# Patient Record
Sex: Female | Born: 1950 | Race: Black or African American | Hispanic: No | Marital: Married | Smoking: Former smoker
Health system: Southern US, Community
[De-identification: ages and names within clinical notes are randomized; demographics above are authoritative.]

## PROBLEM LIST (undated history)

## (undated) DIAGNOSIS — I10 Essential (primary) hypertension: Secondary | ICD-10-CM

## (undated) DIAGNOSIS — F419 Anxiety disorder, unspecified: Secondary | ICD-10-CM

## (undated) DIAGNOSIS — F32A Depression, unspecified: Secondary | ICD-10-CM

## (undated) DIAGNOSIS — I2510 Atherosclerotic heart disease of native coronary artery without angina pectoris: Secondary | ICD-10-CM

## (undated) DIAGNOSIS — G709 Myoneural disorder, unspecified: Secondary | ICD-10-CM

## (undated) DIAGNOSIS — E785 Hyperlipidemia, unspecified: Secondary | ICD-10-CM

## (undated) DIAGNOSIS — R7303 Prediabetes: Secondary | ICD-10-CM

## (undated) DIAGNOSIS — F329 Major depressive disorder, single episode, unspecified: Secondary | ICD-10-CM

## (undated) DIAGNOSIS — K5900 Constipation, unspecified: Secondary | ICD-10-CM

## (undated) HISTORY — PX: POLYPECTOMY: SHX149

## (undated) HISTORY — DX: Major depressive disorder, single episode, unspecified: F32.9

## (undated) HISTORY — DX: Myoneural disorder, unspecified: G70.9

## (undated) HISTORY — DX: Constipation, unspecified: K59.00

## (undated) HISTORY — DX: Essential (primary) hypertension: I10

## (undated) HISTORY — PX: DILATION AND CURETTAGE OF UTERUS: SHX78

## (undated) HISTORY — DX: Hyperlipidemia, unspecified: E78.5

## (undated) HISTORY — PX: COLONOSCOPY: SHX174

## (undated) HISTORY — DX: Depression, unspecified: F32.A

## (undated) HISTORY — DX: Anxiety disorder, unspecified: F41.9

---

## 1997-12-13 ENCOUNTER — Emergency Department (HOSPITAL_COMMUNITY): Admission: EM | Admit: 1997-12-13 | Discharge: 1997-12-13 | Payer: Self-pay | Admitting: Emergency Medicine

## 1999-07-30 ENCOUNTER — Other Ambulatory Visit: Admission: RE | Admit: 1999-07-30 | Discharge: 1999-07-30 | Payer: Self-pay | Admitting: Gastroenterology

## 1999-07-30 ENCOUNTER — Encounter (INDEPENDENT_AMBULATORY_CARE_PROVIDER_SITE_OTHER): Payer: Self-pay | Admitting: Specialist

## 1999-08-03 ENCOUNTER — Ambulatory Visit (HOSPITAL_COMMUNITY): Admission: RE | Admit: 1999-08-03 | Discharge: 1999-08-03 | Payer: Self-pay | Admitting: Gastroenterology

## 1999-08-03 ENCOUNTER — Encounter: Payer: Self-pay | Admitting: Gastroenterology

## 1999-08-08 ENCOUNTER — Other Ambulatory Visit: Admission: RE | Admit: 1999-08-08 | Discharge: 1999-08-08 | Payer: Self-pay | Admitting: Obstetrics and Gynecology

## 1999-08-08 ENCOUNTER — Encounter (INDEPENDENT_AMBULATORY_CARE_PROVIDER_SITE_OTHER): Payer: Self-pay

## 2000-02-16 ENCOUNTER — Inpatient Hospital Stay (HOSPITAL_COMMUNITY): Admission: EM | Admit: 2000-02-16 | Discharge: 2000-02-20 | Payer: Self-pay | Admitting: *Deleted

## 2000-02-18 ENCOUNTER — Encounter: Payer: Self-pay | Admitting: Gastroenterology

## 2001-09-20 ENCOUNTER — Emergency Department (HOSPITAL_COMMUNITY): Admission: EM | Admit: 2001-09-20 | Discharge: 2001-09-20 | Payer: Self-pay | Admitting: Emergency Medicine

## 2001-09-20 ENCOUNTER — Encounter: Payer: Self-pay | Admitting: Emergency Medicine

## 2003-12-13 ENCOUNTER — Encounter: Admission: RE | Admit: 2003-12-13 | Discharge: 2003-12-13 | Payer: Self-pay | Admitting: Internal Medicine

## 2004-06-13 ENCOUNTER — Ambulatory Visit: Payer: Self-pay | Admitting: Gastroenterology

## 2006-11-04 ENCOUNTER — Emergency Department (HOSPITAL_COMMUNITY): Admission: EM | Admit: 2006-11-04 | Discharge: 2006-11-04 | Payer: Self-pay | Admitting: Family Medicine

## 2007-12-27 ENCOUNTER — Ambulatory Visit: Payer: Self-pay | Admitting: Gastroenterology

## 2008-01-06 ENCOUNTER — Telehealth: Payer: Self-pay | Admitting: Gastroenterology

## 2008-01-11 ENCOUNTER — Ambulatory Visit: Payer: Self-pay | Admitting: Gastroenterology

## 2008-01-11 ENCOUNTER — Encounter: Payer: Self-pay | Admitting: Gastroenterology

## 2008-01-13 ENCOUNTER — Encounter: Payer: Self-pay | Admitting: Gastroenterology

## 2008-05-13 IMAGING — CR DG ABDOMEN ACUTE W/ 1V CHEST
3 series · 3 of 3 positions shown · non-contrast
Comparison: none

CLINICAL DATA: Vomiting and nausea.
 ACUTE ABDOMINAL SERIES WITH CHEST ? 3 VIEW:

[w chest pa]
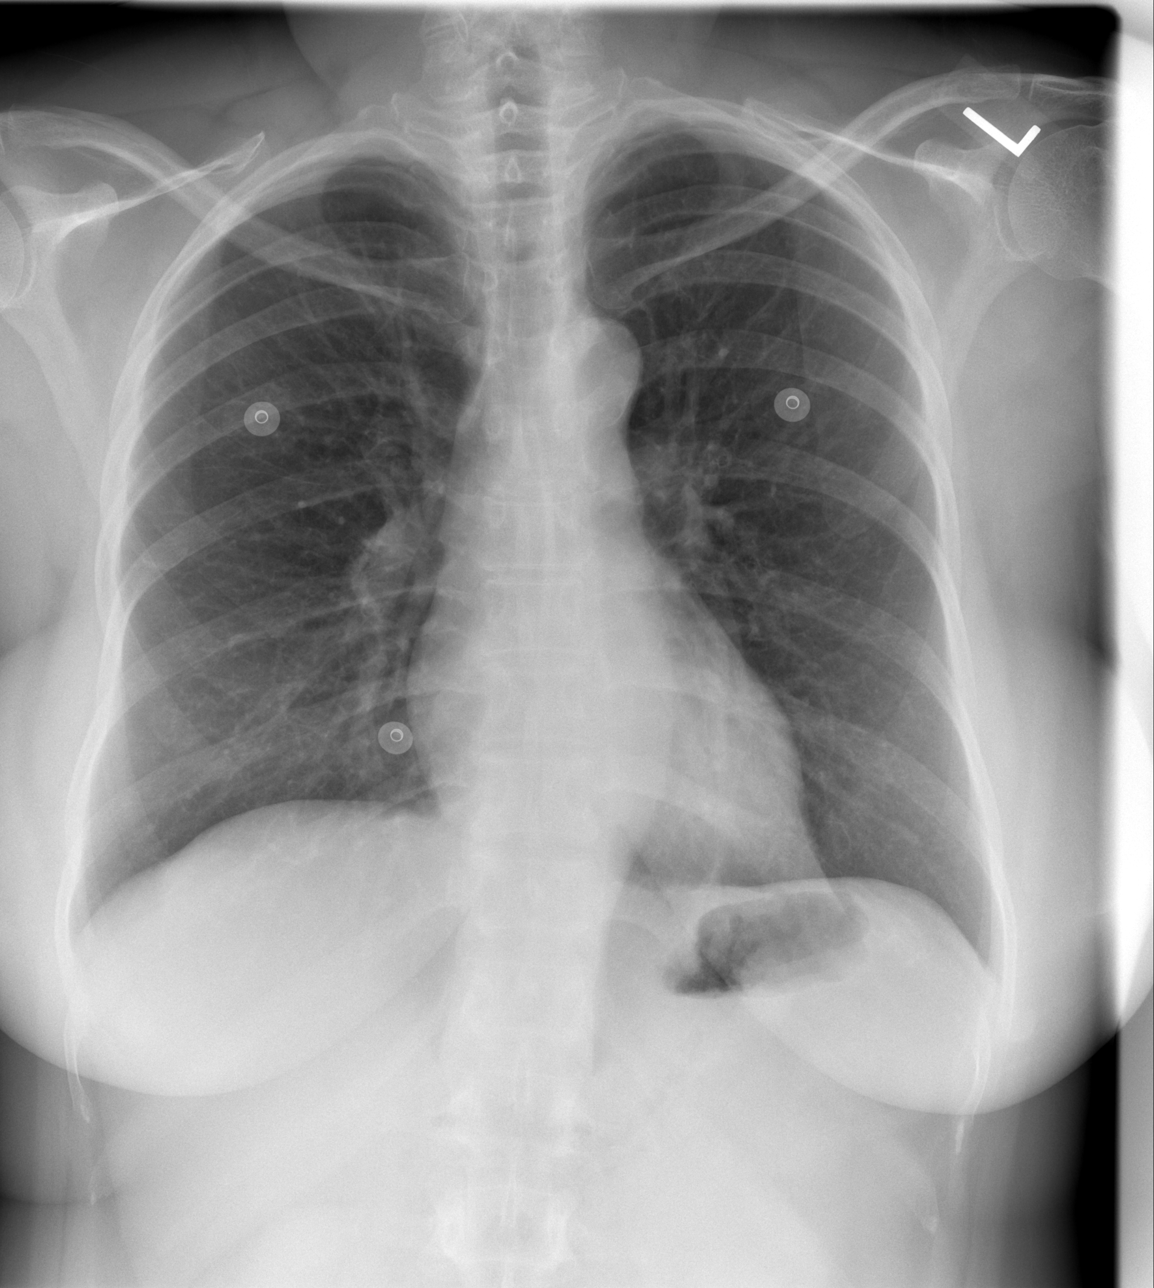

[w abdomen upright]
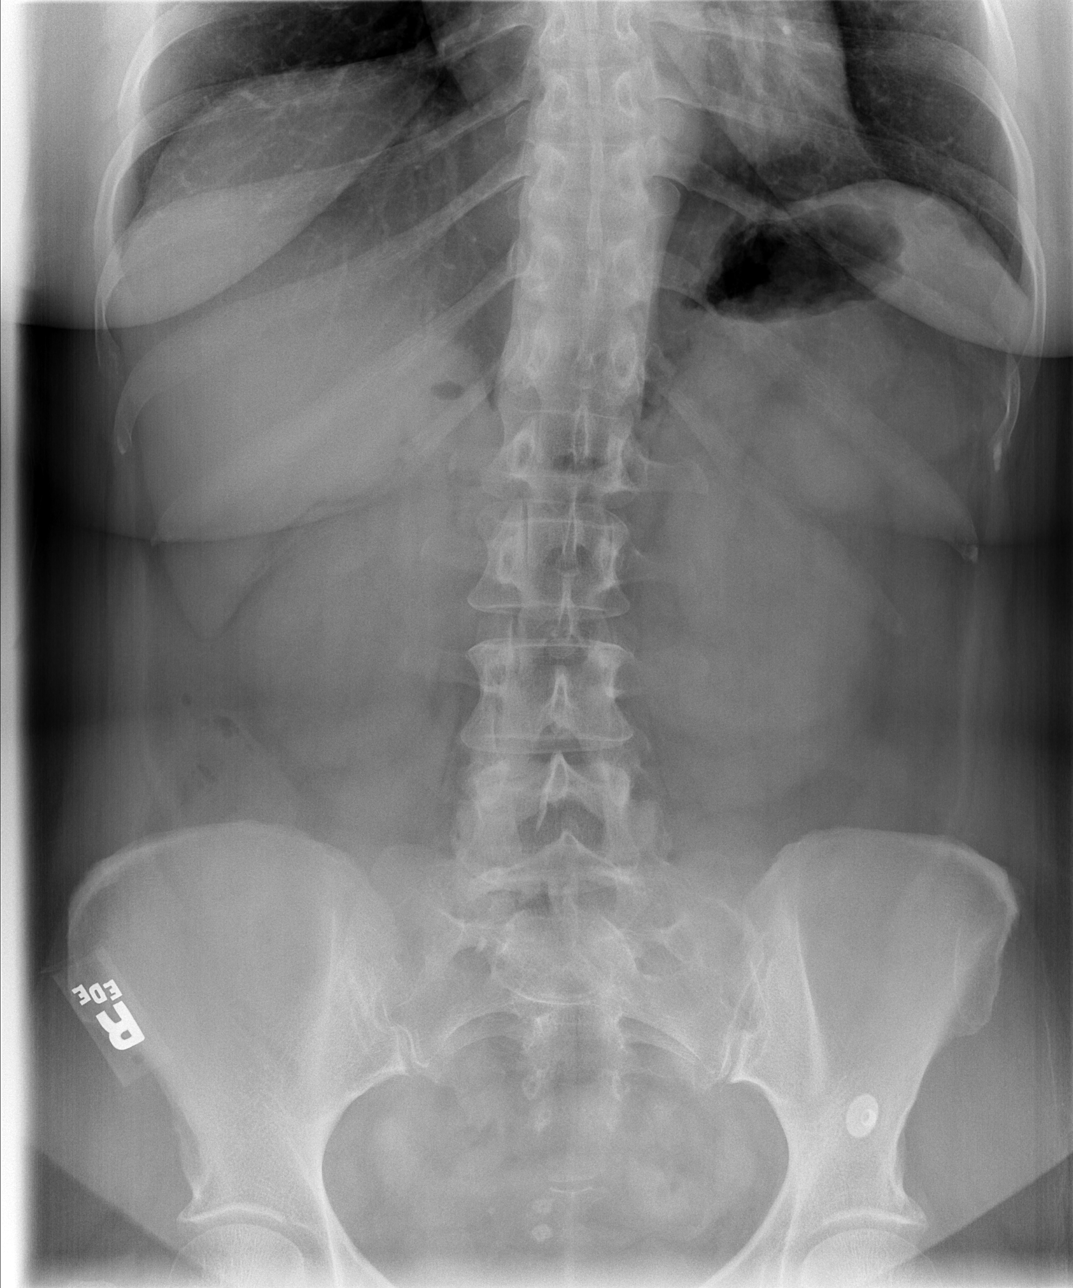

[t abdomen supine]
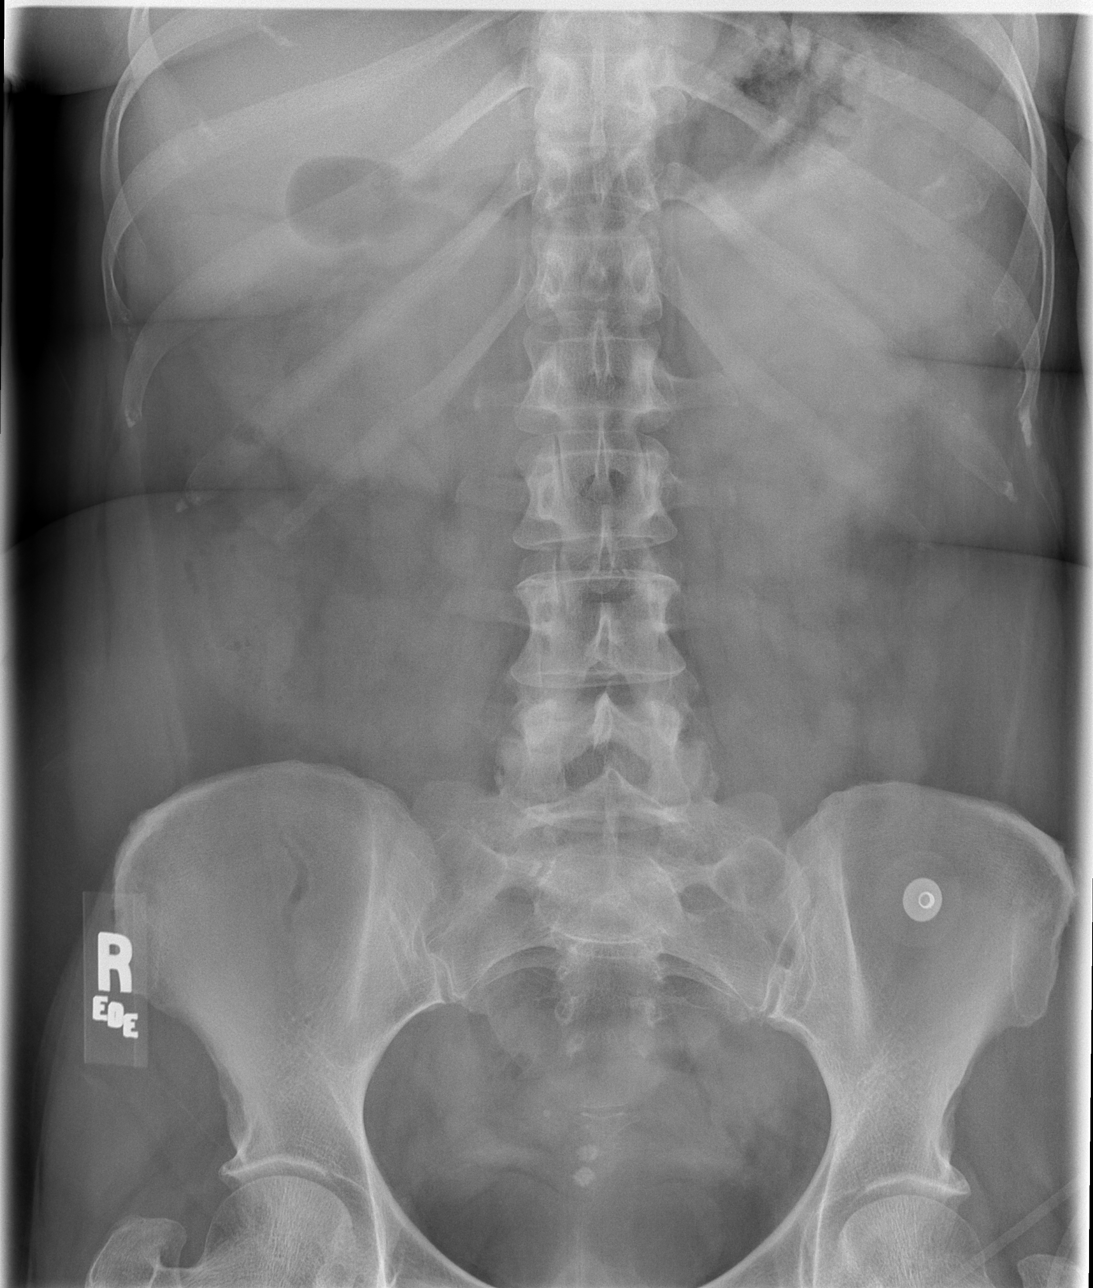

[3 of 3 positions shown; findings below may reference images not displayed]

FINDINGS: Heart size is normal.  The vascularity is normal and the lungs are clear.  Bowel gas pattern is normal without obstruction or free air.  No renal calculi and no bony abnormality are noted.
IMPRESSION: Negative.

## 2010-06-16 ENCOUNTER — Encounter: Payer: Self-pay | Admitting: Internal Medicine

## 2010-10-11 NOTE — Consult Note (Signed)
[facility]  Patient:    Tanya Pace, Tanya Pace                 MRN: [id number] Proc. Date: 02/17/00 Adm. Date:  [id number] Attending:  Judie Petit                          Consultation Report  DATE OF BIRTH:  Jan 07, 1951.  REFERRING PHYSICIAN:  Dr. Lorelle Formosa.  CONCLUSIONS 1. Chest discomfort, nausea, vomiting.  Etiology is uncertain.  This would be    an atypical presentation for coronary disease.  Rule out pericarditis.    Rule out gastrointestinal source. 2. Hypertension. 3. History of gastroesophageal reflux disease.  RECOMMENDATIONS 1. Serial enzymes. 2. EKG during pain to rule out evidence of ischemia. 3. If the patient rules out for infarction, would consider an adenosine    Cardiolite; if she rules in, will obviously need coronary angiography. 4. Consider anxiolytic therapy. 5. Two-dimensional echocardiogram to rule out pericardial disease. 6. Consider noncardiac causes of pain such as GI sources, psychogenic sources    or other.  COMMENTS:  The patient is 60 years of age and has a one-week history of intermittent chest tightness in the substernal region associated with nausea, vomiting and anxiety.  No dyspnea.  Episodes have occurred in waves, lasting anywhere from 30 to 60 minutes.  EKGs during episodes have not shown any evidence of acute ischemia.  Currently, at the time of history, the patient was having "shakes and chills" and so there is a tickle in the lower part of her sternal region.  There is no exertional component to her complaint.  MEDICATIONS:  Clonidine, hydrochlorothiazide and Metamucil.  PAST MEDICAL HISTORY:  Hypertension.  Gastroesophageal reflux.  HABITS:  Positive for smoking one-half to one pack per day.  Positive for ethanol consumption socially.  REVIEW OF SYSTEMS:  Unremarkable with the exception of history of arthritis.  PHYSICAL EXAMINATION  GENERAL:  On exam, patient is  obviously distressed and anxious; she is hyperventilating.  VITAL SIGNS:  Respirations are 24.  Blood pressure 144/80.  Heart rate 68.  SKIN:  Warm and dry.  HEENT:  No xanthelasma.  Extraocular movements are full.  NECK:  No JVD, carotid bruits or thyromegaly.  CHEST:  Clear.  CARDIAC:  Unremarkable.  No clicks, rubs, murmurs or gallops are heard.  ABDOMEN:  Soft.  Liver and spleen are not palpable.  Bowel sounds are normal.  EXTREMITIES:  No edema.  Pulses are 2+ and symmetric.  NEUROLOGIC:  Exam is unremarkable.  LABORATORY AND X-RAY FINDINGS:  EKGs x 5 have revealed a normal sinus rhythm or sinus bradycardia, but no acute ischemic change.  Occasional PACs were noticed.  CK-MB is negative x 3.  Troponin I negative x 2.  Chest x-ray is normal. DD:  02/18/00 TD:  02/18/00 Job: [id number]

## 2010-10-11 NOTE — Discharge Summary (Signed)
[facility]  Patient:    Tanya Pace, Tanya Pace                 MRN: [id number] Adm. Date:  [id number] Disc. Date: 02/20/00 Attending:  Judie Petit                           Discharge Summary  ADMISSION DIAGNOSES: 1. Substernal chest pain. 2. Hypertension.  DISCHARGE DIAGNOSES: 1. Gastroenteritis, probable viral. 2. Hypertension. 3. Anxiety neurosis.  DISCHARGE CONDITION:  Stable.  DISCHARGE FOLLOW-UP:  Follow up in approximately one week.  DISCHARGE MEDICATIONS: 1. Clonidine 0.2 mg b.i.d. 2. HCTZ 25 mg daily. 3. Protonix 40 mg daily. 4. Clorazepate 3.75 mg b.i.d. or t.i.d. p.r.n. nervousness.  HISTORY OF PRESENT ILLNESS:  The patient is a 60 year old married black woman who presented to the emergency room with substernal chest pain that lasted approximately one hour and occurred during BM.  She said the pain was squeezing and midsternal without associated diaphoresis or radiation.  Pain was more intense with deep inspiration and in the past had been relieved by aspirin.  The patient stated that she had had similar episodes in the past, but not quite as severe as this one.  She thus presented to the emergency room and was evaluated in the emergency room by the emergency room physician and referred to on-call physician, Dr. Daphine Deutscher, and admitted.  MEDICATIONS ON ADMISSION:  Clonidine, HCTZ, and Metamucil.  ALLERGIES:  Only allergy is to ERYTHROMYCIN.  PAST MEDICAL HISTORY:  Hypertension and osteoarthritis.  FAMILY HISTORY, SOCIAL HISTORY, PERSONAL HISTORY, AND REVIEW OF SYSTEMS: Outlined in Admission History and Physical.  PHYSICAL EXAMINATION:  VITAL SIGNS:  Blood pressure 135/77, pulse 53, respirations 20, temperature 97.4.  HEENT:  PERRLA, EOMs intact.  NECK:  Supple with no jugular venous distension, no thyromegaly.  CHEST:  Normal lungs to auscultation.  CARDIOVASCULAR:  Heart had regular rhythm with  bradycardia.  ABDOMEN:  Soft and flat with normoactive bowel sounds.  EXTREMITIES:  Had no clubbing, cyanosis, edema, or deformity.  NEUROLOGIC:  Grossly physiologic.  LABORATORY AND X-RAY DATA:  Free T4 was 2.8.  Hepatic functions were normal. CBC revealed hemoglobin 11.8 with white count of 7.2, platelets 327,000. Differential revealed 88 segs, 9 lymphocytes, 2 monocytes.  Lipase was 19 with amylase 123.  Urinalysis was normal except for ketone of greater than 80.  CBC revealed white count of 3.8 initially with red blood cell count of 3.82, hemoglobin 10.9, and repeat white count was 7.2 with hemoglobin 11.8, hematocrit 36, platelets 327.  Initial amylase was 268, repeat was 123.  Lipid profile was normal.  T4 was 8.6 with TSH 0.9 and free T3 of 2.8.  Cardiac enzymes were negative.  Chest x-ray revealed no active disease.  Ultrasound of abdomen was negative.  HOSPITAL COURSE:  The patient was admitted to the hospital and begun on initially some nitroglycerin and nitroglycerin paste 1/2 inch q.6h. to chest. She had rather intense emesis episodes with some associated substernal chest pain which was variably relieved with the nitroglycerin.  She did have some junctional arrhythmia on telemetry monitoring during these episodes.  The patient was provided Phenergan IV and subsequently a single dose of Zofran 4 mg IV with better relief of her nausea.  She was seen in consultation by Dr. Verdis Prime, cardiologist, who did not think her symptoms were related to cardiac etiology.  She was subsequently seen by gastroenterologist, Dr. Russella Dar, who  has seen her in the past and thought she had gastroenteritis probably of viral etiology.  Her treatment was changed from Pepcid 20 mg to Protonix 40 mg IV.  She was continued with similar treatment and gradually improved.  She thus is able to tolerate a diet and totally asymptomatic.  She is at this time being discharged for outpatient  management.  CONSULTANTS:  Dr. Verdis Prime, cardiologist, and Dr. Russella Dar, gastroenterologist.  DISCHARGE DISPOSITION:  The patient is discharged for outpatient management. DD:  02/20/00 TD:  02/20/00 Job: [id number]

## 2010-10-11 NOTE — H&P (Signed)
[facility]  Patient:    Tanya Pace, Tanya Pace                 MRN: [id number] Adm. Date:  [id number] Attending:  Judie Petit                         History and Physical  DATE OF BIRTH:  [birth year].  HISTORY OF PRESENT ILLNESS:  This is a 60 year old African-American female who presented with episode of chest pain x 2.  Patient states that the chest pain lasted approximately one hour and occurred during a bowel movement.  At time of presentation to the ED, patient denied pain.  She characterizes the pain as midsternal, squeezing, accompanied by nausea and shortness of breath.  No diaphoresis or vomiting.  The pain is now radiating and worsens by deep inspiration.  Aspirin did relieve the pain.  Patient with a history of hypertension and has had similar episodes in the past but not as severe as this.  She characterizes the pain as 8/10 at its worst.  She also reports a 30-pound weight loss over the past year.  MEDICATIONS 1. Clonidine q.h.s. 2. HCTZ 25 mg per day. 3. Metamucil.  ALLERGIES:  ERYTHROMYCIN.  PAST MEDICAL HISTORY:  Significant for hypertension and osteoarthritis.  PAST SURGICAL HISTORY:  Significant for colon polyps in March of 2001 and a D&C 30 years ago.  FAMILY HISTORY:  Significant for CHF in the father and two maternal aunts of coronary artery disease.  SOCIAL HISTORY:  She smokes a half a pack per day for the past 16 years.  PHYSICAL EXAMINATION  GENERAL:  In no acute distress.  Alert and oriented.  VITAL SIGNS:  Blood pressure 135/77, heart rate 62, respiratory rate 20, and temperature 97.4.  HEENT:  Mucous membranes moist.  Pupils are equal, round and reactive to light.  Sclerae anicteric.  NECK:  Supple, without adenopathy, thyromegaly, JVD or bruits.  HEART:  Regular rate and rhythm, with no murmurs, gallops, thrills or rubs.  LUNGS:  Clear to auscultation bilaterally.  ABDOMEN:  Soft,  nontender and distended with normoactive bowel sounds.  No masses.  EXTREMITIES:  Moist, without clubbing, cyanosis, or edema.  NEUROLOGIC:  Nonfocal.  LABORATORY AND X-RAY FINDINGS:  Data reveals a CK of 149, troponin of less than 0.03, MB less than 0.3, white count of 10.8, hemoglobin 10.9, hematocrit 32.4, platelets 282,000.  Chest x-ray:  No active disease.  EKG:  Normal sinus rhythm.  CMP:  All within normal limits.  ASSESSMENT:  [age]-year-old with hypertension and chest pain.  Will admit the patient as she does have several risk factors and check serial enzymes to rule out acute myocardial infarction.  Will check 12-lead electrocardiogram in the morning; will also place patient on nitroglycerin paste to anterior chest wall, resume home medications and consult cardiology, once we see if there is any evidence of any myocardial damage. DD:  02/16/00 TD:  02/17/00 Job: [id number]

## 2011-03-13 LAB — COMPREHENSIVE METABOLIC PANEL
Alkaline Phosphatase: 58
BUN: 18
CO2: 29
GFR calc non Af Amer: 45 — ABNORMAL LOW
Glucose, Bld: 122 — ABNORMAL HIGH
Potassium: 2.8 — ABNORMAL LOW
Total Protein: 6.7

## 2011-03-13 LAB — CBC
HCT: 32.5 — ABNORMAL LOW
Hemoglobin: 10.9 — ABNORMAL LOW
MCHC: 33.6
RDW: 14

## 2011-03-13 LAB — DIFFERENTIAL
Basophils Absolute: 0
Basophils Relative: 0
Monocytes Relative: 3
Neutro Abs: 6.7
Neutrophils Relative %: 87 — ABNORMAL HIGH

## 2011-03-13 LAB — B-NATRIURETIC PEPTIDE (CONVERTED LAB): Pro B Natriuretic peptide (BNP): 73

## 2011-03-13 LAB — POCT CARDIAC MARKERS
Myoglobin, poc: 174
Operator id: 189501

## 2011-03-13 LAB — LIPASE, BLOOD: Lipase: 33

## 2012-12-21 ENCOUNTER — Encounter: Payer: Self-pay | Admitting: Gastroenterology

## 2013-01-21 ENCOUNTER — Other Ambulatory Visit: Payer: Self-pay

## 2013-01-21 DIAGNOSIS — Z1231 Encounter for screening mammogram for malignant neoplasm of breast: Secondary | ICD-10-CM

## 2013-02-23 ENCOUNTER — Ambulatory Visit (AMBULATORY_SURGERY_CENTER): Payer: Self-pay | Admitting: *Deleted

## 2013-02-23 VITALS — Ht 65.0 in | Wt 185.2 lb

## 2013-02-23 DIAGNOSIS — Z86010 Personal history of colonic polyps: Secondary | ICD-10-CM

## 2013-02-23 MED ORDER — MOVIPREP 100 G PO SOLR: 1.0000 | Freq: Once | ORAL | Status: DC

## 2013-02-23 NOTE — Progress Notes (Signed)
No egg or soy allergy. ewm last colon 2009 w/ Stark in epic. + TA. Family hx of colon cancer in mother. ewm No home 02 use, no cpap use. ewm Pt declined colon emmi video. ewm No problems with past sedation. ewm

## 2013-02-24 ENCOUNTER — Encounter: Payer: Self-pay | Admitting: Gastroenterology

## 2013-03-11 ENCOUNTER — Encounter: Payer: Self-pay | Admitting: Gastroenterology

## 2013-03-11 ENCOUNTER — Ambulatory Visit (AMBULATORY_SURGERY_CENTER): Admitting: Gastroenterology

## 2013-03-11 VITALS — BP 154/94 | HR 74 | Temp 96.4°F | Resp 13 | Ht 65.0 in | Wt 185.0 lb

## 2013-03-11 DIAGNOSIS — Z800 Family history of malignant neoplasm of digestive organs: Secondary | ICD-10-CM

## 2013-03-11 DIAGNOSIS — D126 Benign neoplasm of colon, unspecified: Secondary | ICD-10-CM

## 2013-03-11 DIAGNOSIS — Z86010 Personal history of colonic polyps: Secondary | ICD-10-CM

## 2013-03-11 MED ORDER — SODIUM CHLORIDE 0.9 % IV SOLN: 500.0000 mL | INTRAVENOUS | Status: DC

## 2013-03-11 NOTE — Progress Notes (Signed)
Patient did not experience any of the following events: a burn prior to discharge; a fall within the facility; wrong site/side/patient/procedure/implant event; or a hospital transfer or hospital admission upon discharge from the facility. (G8907) Patient did not have preoperative order for IV antibiotic SSI prophylaxis. (G8918)  

## 2013-03-11 NOTE — Op Note (Signed)
[facility] [address] [place], [address]   COLONOSCOPY PROCEDURE REPORT  PATIENT: Tanya, Pace  MR#: [id number] BIRTHDATE: [birth year] , 62  yrs. old GENDER: Female ENDOSCOPIST: Meryl Dare, MD, [facility] PROCEDURE DATE:  03/11/2013 PROCEDURE:   Colonoscopy with snare polypectomy First Screening Colonoscopy - Avg.  risk and is 50 yrs.  old or older - No.  Prior Negative Screening - Now for repeat screening. N/A  History of Adenoma - Now for follow-up colonoscopy & has been > or = to 3 yrs.  Yes hx of adenoma.  Has been 3 or more years since last colonoscopy.  Polyps Removed Today? Yes. ASA CLASS:   Class II INDICATIONS:Patient's personal history of adenomatous colon polyps and patient's immediate family history of colon cancer. MEDICATIONS: MAC sedation, administered by CRNA and propofol (Diprivan) 200mg  IV DESCRIPTION OF PROCEDURE:   After the risks benefits and alternatives of the procedure were thoroughly explained, informed consent was obtained.  A digital rectal exam revealed no abnormalities of the rectum.   The [id number]  endoscope was introduced through the anus and advanced to the cecum, which was identified by both the appendix and ileocecal valve. No adverse events experienced.   The quality of the prep was good, using MoviPrep  The instrument was then slowly withdrawn as the colon was fully examined.  COLON FINDINGS: Three sessile polyps measuring 4-6 mm in size were found in the sigmoid colon.  A polypectomy was performed with a cold snare.  The resection was complete and the polyp tissue was completely retrieved.   The colon was otherwise normal.  There was no diverticulosis, inflammation, polyps or cancers unless previously stated.  Retroflexed views revealed moderate internal hemorrhoids. The time to cecum=3 minutes 15 seconds.  Withdrawal time=9 minutes 46 seconds.  The scope was withdrawn and the procedure  completed. COMPLICATIONS: There were no complications.  ENDOSCOPIC IMPRESSION: 1.   Three sessile polyps measuring 4-6 mm in the sigmoid colon; polypectomy performed with a cold snare 2.   Moderate internal hemorrhoids  RECOMMENDATIONS: 1.  Await pathology results 2.  Repeat Colonoscopy in 5 years.  eSigned:  Meryl Dare, MD, [facility] 03/11/2013 1:53 PM   cc: Dorothyann Peng, MD

## 2013-03-11 NOTE — Progress Notes (Signed)
Called to room to assist during endoscopic procedure.  Patient ID and intended procedure confirmed with present staff. Received instructions for my participation in the procedure from the performing physician.  

## 2013-03-11 NOTE — Progress Notes (Signed)
Procedure ends, to recovery, report given and VSS. 

## 2013-03-11 NOTE — Progress Notes (Signed)
No egg or soy allergy. ewm 

## 2013-03-11 NOTE — Patient Instructions (Signed)

## 2013-03-14 ENCOUNTER — Telehealth: Payer: Self-pay | Admitting: *Deleted

## 2013-03-14 NOTE — Telephone Encounter (Signed)
  Follow up Call-  Call back number 03/11/2013  Post procedure Call Back phone  # [phone]  Permission to leave phone message Yes   Spoke with her husband  Patient questions:  Do you have a fever, pain , or abdominal swelling? no Pain Score  0 *  Have you tolerated food without any problems? yes  Have you been able to return to your normal activities? yes  Do you have any questions about your discharge instructions: Diet   no Medications  no Follow up visit  no  Do you have questions or concerns about your Care? no  Actions: * If pain score is 4 or above: No action needed, pain <4.

## 2013-03-15 ENCOUNTER — Encounter: Payer: Self-pay | Admitting: Gastroenterology

## 2014-12-19 ENCOUNTER — Encounter: Payer: Self-pay | Admitting: Gastroenterology

## 2015-04-03 ENCOUNTER — Encounter: Payer: Self-pay | Admitting: Cardiovascular Disease

## 2015-04-03 ENCOUNTER — Ambulatory Visit (INDEPENDENT_AMBULATORY_CARE_PROVIDER_SITE_OTHER): Admitting: Cardiovascular Disease

## 2015-04-03 VITALS — BP 130/80 | HR 56 | Resp 16 | Ht 65.0 in | Wt 178.0 lb

## 2015-04-03 DIAGNOSIS — I10 Essential (primary) hypertension: Secondary | ICD-10-CM

## 2015-04-03 DIAGNOSIS — I7300 Raynaud's syndrome without gangrene: Secondary | ICD-10-CM

## 2015-04-03 DIAGNOSIS — E785 Hyperlipidemia, unspecified: Secondary | ICD-10-CM

## 2015-04-03 DIAGNOSIS — R002 Palpitations: Secondary | ICD-10-CM

## 2015-04-03 DIAGNOSIS — Z79899 Other long term (current) drug therapy: Secondary | ICD-10-CM | POA: Diagnosis not present

## 2015-04-03 DIAGNOSIS — R001 Bradycardia, unspecified: Secondary | ICD-10-CM

## 2015-04-03 MED ORDER — VERAPAMIL HCL ER 180 MG PO TBCR: 180.0000 mg | EXTENDED_RELEASE_TABLET | Freq: Every day | ORAL | Status: DC

## 2015-04-03 NOTE — Patient Instructions (Signed)
Your physician has recommended you make the following change in your medication:   WEAN OFF OF METOPROLOL BY TAKING 1/2 TABLET X 4 DAYS THEN STOP.  START VERAPAMIL SR 180 MG DAILY.  THIS HAS BEEN SENT TO YOUR PHARMACY.  Your physician recommends that you return for lab work in: [place].  Dr. Sallyanne Kuster recommends that you schedule a follow-up appointment in: 3-4 MONTHS

## 2015-04-03 NOTE — Progress Notes (Signed)
Patient ID: Tanya Pace, female   DOB: 06-18-1950, 64 y.o.   MRN: [id number]     Cardiology Office Note   Date:  04/05/2015   ID:  Tanya Pace 1950-08-02, MRN [id number]  PCP:  Maximino Greenland, MD  Cardiologist:   Sanda Klein, MD   Chief Complaint  Patient presents with  . New Patient (Initial Visit)  . Dizziness  . Edema    hand, feet, legs, and ankles everyday      History of Present Illness: Tanya Pace is a 64 y.o. female who presents to re-establish Cardiology follow-up.  She used to see Dr. Melvern Banker for "episodic high BP", palpitations and Raynaud's sd. His old records are not currently available for review.  Her BP has been well controlled. She continues to have symptoms of Raynaud's during both the cold and the warm season. Her fingers and toes become pale and numb. She has very infrequent palpitations now. She denies dyspnea and angina.   She is no longer taking any lipid lowering agents. She was started on neurontin for neuropathy  Past Medical History  Diagnosis Date  . Hyperlipidemia   . Neuromuscular disorder ([place])     peripheral neuropathy  . Anxiety   . Diabetes mellitus without complication ([place])     borderline-no meds  . Depression   . Hypertension   . Constipation     Past Surgical History  Procedure Laterality Date  . Colonoscopy    . Polypectomy    . Dilation and curettage of uterus      x 5-6 1979-1982     Current Outpatient Prescriptions  Medication Sig Dispense Refill  . gabapentin (NEURONTIN) 300 MG capsule Take 300 mg by mouth 3 (three) times daily. [phone] mg as needed    . lisinopril-hydrochlorothiazide (PRINZIDE,ZESTORETIC) 10-12.5 MG per tablet Take 1 tablet by mouth daily.    . metoprolol succinate (TOPROL-XL) 25 MG 24 hr tablet Take 25 mg by mouth daily.    Marland Kitchen POTASSIUM CHLORIDE PO Take 1 capsule by mouth daily.    . verapamil (CALAN-SR) 180 MG CR tablet Take 1 tablet (180 mg total) by mouth at  bedtime. 90 tablet 3   No current facility-administered medications for this visit.    Allergies:   Penicillins and Erythromycin    Social History:  The patient  reports that she has quit smoking. She has never used smokeless tobacco. She reports that she drinks alcohol. She reports that she does not use illicit drugs.   Family History:  The patient's family history includes Colon cancer in her mother; Heart disease in her father. There is no history of Esophageal cancer, Rectal cancer, or Stomach cancer.    ROS:  Please see the history of present illness.    Otherwise, review of systems positive for none.   All other systems are reviewed and negative.    PHYSICAL EXAM: VS:  BP 130/80 mmHg  Pulse 56  Resp 16  Ht 5\' 5"  (1.651 m)  Wt 178 lb (80.74 kg)  BMI 29.62 kg/m2 , BMI Body mass index is 29.62 kg/(m^2).  General: Alert, oriented x3, no distress Head: no evidence of trauma, PERRL, EOMI, no exophtalmos or lid lag, no myxedema, no xanthelasma; normal ears, nose and oropharynx Neck: normal jugular venous pulsations and no hepatojugular reflux; brisk carotid pulses without delay and no carotid bruits Chest: clear to auscultation, no signs of consolidation by percussion or palpation, normal fremitus, symmetrical and full respiratory excursions Cardiovascular: normal position  and quality of the apical impulse, regular rhythm, normal first and second heart sounds, no  murmurs, rubs or gallops Abdomen: no tenderness or distention, no masses by palpation, no abnormal pulsatility or arterial bruits, normal bowel sounds, no hepatosplenomegaly Extremities: no clubbing, cyanosis or edema; 2+ radial, ulnar and brachial pulses bilaterally; 2+ right femoral, posterior tibial and dorsalis pedis pulses; 2+ left femoral, posterior tibial and dorsalis pedis pulses; no subclavian or femoral bruits Neurological: grossly nonfocal Psych: euthymic mood, full affect   EKG:  EKG is ordered today. The  ekg ordered today demonstrates sinus bradycardia, otherwise normal   Wt Readings from Last 3 Encounters:  04/03/15 178 lb (80.74 kg)  03/11/13 185 lb (83.915 kg)  02/23/13 185 lb 3.2 oz (84.006 kg)     ASSESSMENT AND PLAN:  1. HTN, well controlled 2. History of palpitations, quiescent 3. Hyperlipidemia, untreated - need to reassess 4. Raynaud's sd. Will try to see if verapamil provides symptom relief while preventing palpitations and controlling BP, rather than metoprolol which can worsen vasospasm. If it works, will avoid simva- or atorvastatin for lipid control.    Current medicines are reviewed at length with the patient today.  The patient does not have concerns regarding medicines  Labs/ tests ordered today include:  Orders Placed This Encounter  Procedures  . Lipid panel  . Comprehensive metabolic panel  . EKG 12-Lead    Patient Instructions  Your physician has recommended you make the following change in your medication:   WEAN OFF OF METOPROLOL BY TAKING 1/2 TABLET X 4 DAYS THEN STOP.  START VERAPAMIL SR 180 MG DAILY.  THIS HAS BEEN SENT TO YOUR PHARMACY.  Your physician recommends that you return for lab work in: [place].  Dr. Sallyanne Kuster recommends that you schedule a follow-up appointment in: 3-4 MONTHS        Signed, Alexys Lobello, MD  04/05/2015 10:31 PM    Sanda Klein, MD, [facility] [phone] office [phone] pager

## 2015-04-04 ENCOUNTER — Ambulatory Visit: Admitting: Cardiovascular Disease

## 2015-04-05 ENCOUNTER — Encounter: Payer: Self-pay | Admitting: Cardiovascular Disease

## 2015-04-05 DIAGNOSIS — I10 Essential (primary) hypertension: Secondary | ICD-10-CM | POA: Insufficient documentation

## 2015-04-05 DIAGNOSIS — R002 Palpitations: Secondary | ICD-10-CM | POA: Insufficient documentation

## 2015-04-05 DIAGNOSIS — I7300 Raynaud's syndrome without gangrene: Secondary | ICD-10-CM | POA: Insufficient documentation

## 2015-04-05 DIAGNOSIS — R001 Bradycardia, unspecified: Secondary | ICD-10-CM | POA: Insufficient documentation

## 2015-04-05 DIAGNOSIS — E785 Hyperlipidemia, unspecified: Secondary | ICD-10-CM | POA: Insufficient documentation

## 2015-04-13 ENCOUNTER — Ambulatory Visit: Admitting: Cardiovascular Disease

## 2015-04-27 LAB — COMPREHENSIVE METABOLIC PANEL
ALT: 9 U/L (ref 6–29)
AST: 16 U/L (ref 10–35)
Albumin: 3.7 g/dL (ref 3.6–5.1)
Alkaline Phosphatase: 67 U/L (ref 33–130)
BILIRUBIN TOTAL: 0.5 mg/dL (ref 0.2–1.2)
BUN: 15 mg/dL (ref 7–25)
CHLORIDE: 103 mmol/L (ref 98–110)
CO2: 31 mmol/L (ref 20–31)
CREATININE: 0.91 mg/dL (ref 0.50–0.99)
Calcium: 9.2 mg/dL (ref 8.6–10.4)
Glucose, Bld: 98 mg/dL (ref 65–99)
Potassium: 3.8 mmol/L (ref 3.5–5.3)
SODIUM: 140 mmol/L (ref 135–146)
TOTAL PROTEIN: 6.6 g/dL (ref 6.1–8.1)

## 2015-04-27 LAB — LIPID PANEL
Cholesterol: 202 mg/dL — ABNORMAL HIGH (ref 125–200)
HDL: 63 mg/dL (ref >=46)
LDL CALC: 127 mg/dL (ref <130)
Total CHOL/HDL Ratio: 3.2 Ratio (ref <=5.0)
Triglycerides: 59 mg/dL (ref <150)
VLDL: 12 mg/dL (ref <30)

## 2015-04-27 NOTE — Progress Notes (Signed)
[facility] 04/27/15 11:10am

## 2015-05-02 NOTE — Progress Notes (Signed)
[facility] 04/27/15 AND 05/02/15 FOR LAB RESULTS

## 2015-06-01 ENCOUNTER — Telehealth: Payer: Self-pay | Admitting: Cardiovascular Disease

## 2015-06-01 NOTE — Telephone Encounter (Signed)
BPs are elevated after weaning off metoprolol and adding verapamil.  She reports readings ranging 140s-160s over 70s-110s, HR 70s-101.  Would like to know if Dr. Sallyanne Kuster wants to make any further medicine changes. She is taking her blood pressure several times each day and very concerned with the elevated readings.  Also, she misplaced the printed prescriptions that were given to her at her last visit. She had requested they be mailed to her at the end of December and is not sure if they were, has not received them yet.  She states it is possible to Escribe to [place] now with the following reference # [id number].   Pt is aware I am forwarding to MD and his covering CMA for recommendations and someone will call her back.

## 2015-06-01 NOTE — Telephone Encounter (Signed)
Pt is calling in to speak with the nurse about her Verapamil prescription. She does not feel as though it is helping. She would like to know if there is another medication that she could take. Please f/u   Thanks

## 2015-06-01 NOTE — Telephone Encounter (Signed)
I would first try increasing the verapamil to 360 mg daily.  Have the Raynaud's sd symptoms improved? If no benefit from the change and the BP stays high, we can switch back to metoprolol. If there is improvement in the Raynaud's sd. We can add another low dose of a different BP med such as a diuretic.

## 2015-06-04 NOTE — Telephone Encounter (Signed)
Left a message for the patient to call back.  

## 2015-06-05 MED ORDER — VERAPAMIL HCL ER 180 MG PO TBCR: 360.0000 mg | EXTENDED_RELEASE_TABLET | Freq: Every day | ORAL | Status: DC

## 2015-06-05 NOTE — Telephone Encounter (Signed)
Patient to call back [place] pharmacy number to call in RX Lisinopril Instructed patient to increase Verapamil to 360 mg from 180 mg daily.   Patient will increase for one week and call back with BP readings Patient does not see any improvement with Raynaud's symptoms

## 2015-06-05 NOTE — Telephone Encounter (Signed)
Left voice message to call back office

## 2015-06-05 NOTE — Telephone Encounter (Signed)
Will forward to [employer].

## 2015-06-06 MED ORDER — VERAPAMIL HCL ER 180 MG PO TBCR: 360.0000 mg | EXTENDED_RELEASE_TABLET | Freq: Every day | ORAL | Status: DC

## 2015-06-06 MED ORDER — LISINOPRIL-HYDROCHLOROTHIAZIDE 10-12.5 MG PO TABS: 1.0000 | ORAL_TABLET | Freq: Every day | ORAL | Status: DC

## 2015-06-06 NOTE — Telephone Encounter (Signed)
Meds sent to new pharmacy. Called pt to confirm, no answer or VM pickup when dialed.

## 2015-06-06 NOTE — Telephone Encounter (Signed)
Mrs. Dirk is calling to give the number to [facility] ..1- [phone] and the NCPDP# [id number].Marland Kitchen Thanks

## 2015-06-08 NOTE — Telephone Encounter (Signed)
Refills sent electronically to [facility] 06/06/15.

## 2015-08-03 ENCOUNTER — Encounter: Payer: Self-pay | Admitting: Cardiovascular Disease

## 2015-08-03 ENCOUNTER — Ambulatory Visit (INDEPENDENT_AMBULATORY_CARE_PROVIDER_SITE_OTHER): Admitting: Cardiovascular Disease

## 2015-08-03 VITALS — BP 116/70 | HR 62 | Resp 16 | Ht 65.5 in | Wt 174.8 lb

## 2015-08-03 DIAGNOSIS — Z79899 Other long term (current) drug therapy: Secondary | ICD-10-CM

## 2015-08-03 DIAGNOSIS — R002 Palpitations: Secondary | ICD-10-CM

## 2015-08-03 DIAGNOSIS — R001 Bradycardia, unspecified: Secondary | ICD-10-CM

## 2015-08-03 DIAGNOSIS — E785 Hyperlipidemia, unspecified: Secondary | ICD-10-CM

## 2015-08-03 DIAGNOSIS — I10 Essential (primary) hypertension: Secondary | ICD-10-CM | POA: Diagnosis not present

## 2015-08-03 DIAGNOSIS — R3589 Other polyuria: Secondary | ICD-10-CM

## 2015-08-03 DIAGNOSIS — R358 Other polyuria: Secondary | ICD-10-CM | POA: Diagnosis not present

## 2015-08-03 LAB — COMPREHENSIVE METABOLIC PANEL
ALK PHOS: 75 U/L (ref 33–130)
ALT: 17 U/L (ref 6–29)
AST: 20 U/L (ref 10–35)
Albumin: 4 g/dL (ref 3.6–5.1)
BUN: 18 mg/dL (ref 7–25)
CO2: 32 mmol/L — ABNORMAL HIGH (ref 20–31)
CREATININE: 1.03 mg/dL — AB (ref 0.50–0.99)
Calcium: 9.5 mg/dL (ref 8.6–10.4)
Chloride: 99 mmol/L (ref 98–110)
GLUCOSE: 101 mg/dL — AB (ref 65–99)
Potassium: 4.1 mmol/L (ref 3.5–5.3)
SODIUM: 138 mmol/L (ref 135–146)
TOTAL PROTEIN: 7.3 g/dL (ref 6.1–8.1)
Total Bilirubin: 0.3 mg/dL (ref 0.2–1.2)

## 2015-08-03 MED ORDER — CIPROFLOXACIN HCL 500 MG PO TABS
500.0000 mg | ORAL_TABLET | Freq: Two times a day (BID) | ORAL | Status: DC
Start: 2015-08-03 — End: 2017-03-20

## 2015-08-03 NOTE — Progress Notes (Signed)
Patient ID: Tanya Pace, female   DOB: 11/06/1950, 65 y.o.   MRN: [id number]    Cardiology Office Note    Date:  08/05/2015   ID:  Tanya, Pace 1951-01-10, MRN [id number]  PCP:  Maximino Greenland, MD  Cardiologist:   Sanda Klein, MD   Chief Complaint  Patient presents with  . Follow-up    3-4 months  pt c/o heart racing/SOB on exertion, occasional swelling in feet/hands--pt states she is up every hour to use the bathroom, urine has been darker than normal, pain in back , concerned about kidneys    History of Present Illness:  Tanya Pace is a 65 y.o. female with a history of Raynaud's syndrome, hyperlipidemia, hypertension, borderline diabetes mellitus not requiring medications presenting for routine follow-up.  She states that she feels her heart rate speed up when she performs physical activity, but I think she is describing sinus tachycardia since the onset and termination of the palpitations is gradual. She denies angina at rest or with activity. She has functional class II exertional dyspnea but is able to perform her own household chores without stopping to rest. Her biggest complaint is the fact that she is urinating very frequently. She uses the restroom hourly and reports that her urine has been dark. She has had some flank discomfort but she denies dysuria, urgency or colicky pain. She is drinking lots of fluids. She is concerned that her diabetes may be out of control. She is not taking any medication for hyperlipidemia this time.  Past Medical History  Diagnosis Date  . Hyperlipidemia   . Neuromuscular disorder ([place])     peripheral neuropathy  . Anxiety   . Diabetes mellitus without complication ([place])     borderline-no meds  . Depression   . Hypertension   . Constipation     Past Surgical History  Procedure Laterality Date  . Colonoscopy    . Polypectomy    . Dilation and curettage of uterus      x 5-6 1979-1982    Outpatient  Prescriptions Prior to Visit  Medication Sig Dispense Refill  . gabapentin (NEURONTIN) 300 MG capsule Take 300 mg by mouth 3 (three) times daily. [phone] mg as needed    . lisinopril-hydrochlorothiazide (PRINZIDE,ZESTORETIC) 10-12.5 MG tablet Take 1 tablet by mouth daily. 90 tablet 2  . POTASSIUM CHLORIDE PO Take 1 capsule by mouth daily.    . verapamil (CALAN-SR) 180 MG CR tablet Take 2 tablets (360 mg total) by mouth at bedtime. 180 tablet 2   No facility-administered medications prior to visit.     Allergies:   Penicillins and Erythromycin   Social History   Social History  . Marital Status: Married    Spouse Name: N/A  . Number of Children: N/A  . Years of Education: N/A   Social History Main Topics  . Smoking status: Former Research scientist (life sciences)  . Smokeless tobacco: Never Used  . Alcohol Use: Yes     Comment: rare-2x a year  . Drug Use: No  . Sexual Activity: Not Asked   Other Topics Concern  . None   Social History Narrative     Family History:  The patient's family history includes Colon cancer in her mother; Heart disease in her father. There is no history of Esophageal cancer, Rectal cancer, or Stomach cancer.   ROS:   Please see the history of present illness.    ROS All other systems reviewed and are negative.   PHYSICAL EXAM:  VS:  BP 116/70 mmHg  Pulse 62  Ht 5' 5.5" (1.664 m)  Wt 79.289 kg (174 lb 12.8 oz)  BMI 28.64 kg/m2   GEN: Well nourished, well developed, in no acute distress HEENT: normal Neck: no JVD, carotid bruits, or masses Cardiac: RRR; no murmurs, rubs, or gallops,no edema  Respiratory:  clear to auscultation bilaterally, normal work of breathing GI: soft, nontender, nondistended, + BS MS: no deformity or atrophy Skin: warm and dry, no rash Neuro:  Alert and Oriented x 3, Strength and sensation are intact Psych: euthymic mood, full affect  Wt Readings from Last 3 Encounters:  08/03/15 79.289 kg (174 lb 12.8 oz)  04/03/15 80.74 kg (178 lb)    03/11/13 83.915 kg (185 lb)      Studies/Labs Reviewed:   EKG:  EKG is not ordered today.  Normal ECG 04/04/2015  Recent Labs: 08/03/2015: ALT 17; BUN 18; Creat 1.03*; Potassium 4.1; Sodium 138   Lipid Panel    Component Value Date/Time   CHOL 202* 04/26/2015 1025   TRIG 59 04/26/2015 1025   HDL 63 04/26/2015 1025   CHOLHDL 3.2 04/26/2015 1025   VLDL 12 04/26/2015 1025   LDLCALC 127 04/26/2015 1025     ASSESSMENT:    1. Essential hypertension   2. Medication management   3. Polyuria   4. Sinus bradycardia   5. Hyperlipidemia   6. Heart palpitations      PLAN:  In order of problems listed above:  1. BP is well-controlled. She has lost a little weight 2. Check renal function and electrolytes 3. Check serum glucose and urinalysis, empirical treatment with short course of antibiotics 4. Bradycardia is borderline today, tolerating verapamil without side effects 5. Her LDL cholesterol is not an optimal range, but is partially compensated by an excellent HDL cholesterol. No known vascular disease. We'll continue to hold off treatment 6. I think she is describing sinus tachycardia, may be a sign of deconditioning. Symptoms are not severe. We'll plan arrhythmia monitoring if she develops unprovoked symptoms at rest with abrupt onset and termination more consistent with true arrhythmia.    Medication Adjustments/Labs and Tests Ordered: Current medicines are reviewed at length with the patient today.  Concerns regarding medicines are outlined above.  Medication changes, Labs and Tests ordered today are listed in the Patient Instructions below. Patient Instructions  Your physician recommends that you return for lab work in: today at [facility] lab first floor  Your physician has recommended you make the following change in your medication: CIPRO 500 MG TAKE TWICE DAILY X 3 DAYS   Dr. Sallyanne Kuster recommends that you schedule a follow-up appointment in: ONE YEAR           SignedSanda Klein, MD  08/05/2015 12:14 PM    [facility] [address], [place], [place]  [address] Phone: [phone]; Fax: [phone]

## 2015-08-03 NOTE — Patient Instructions (Signed)
Your physician recommends that you return for lab work in: today at [facility] lab first floor  Your physician has recommended you make the following change in your medication: CIPRO 500 MG TAKE TWICE DAILY X 3 DAYS   Dr. Sallyanne Kuster recommends that you schedule a follow-up appointment in: [place]

## 2015-08-04 LAB — URINALYSIS W MICROSCOPIC + REFLEX CULTURE
BACTERIA UA: NONE SEEN [HPF]
BILIRUBIN URINE: NEGATIVE
CASTS: NONE SEEN [LPF]
CRYSTALS: NONE SEEN [HPF]
Glucose, UA: NEGATIVE
Hgb urine dipstick: NEGATIVE
KETONES UR: NEGATIVE
Leukocytes, UA: NEGATIVE
Nitrite: NEGATIVE
PROTEIN: NEGATIVE
SPECIFIC GRAVITY, URINE: 1.022 (ref 1.001–1.035)
Yeast: NONE SEEN [HPF]
pH: 6 (ref 5.0–8.0)

## 2015-08-05 ENCOUNTER — Encounter: Payer: Self-pay | Admitting: Cardiovascular Disease

## 2016-09-06 ENCOUNTER — Ambulatory Visit (INDEPENDENT_AMBULATORY_CARE_PROVIDER_SITE_OTHER)

## 2016-09-06 ENCOUNTER — Ambulatory Visit
Admission: EM | Admit: 2016-09-06 | Discharge: 2016-09-06 | Disposition: A | Discharge status: Home / Self Care | Attending: Family Medicine | Admitting: Family Medicine

## 2016-09-06 DIAGNOSIS — I10 Essential (primary) hypertension: Secondary | ICD-10-CM | POA: Diagnosis not present

## 2016-09-06 DIAGNOSIS — S92355A Nondisplaced fracture of fifth metatarsal bone, left foot, initial encounter for closed fracture: Secondary | ICD-10-CM | POA: Diagnosis not present

## 2016-09-06 MED ORDER — VERAPAMIL HCL ER 180 MG PO TBCR: 360.0000 mg | EXTENDED_RELEASE_TABLET | Freq: Every day | ORAL | 0 refills | Status: AC

## 2016-09-06 MED ORDER — LISINOPRIL-HYDROCHLOROTHIAZIDE 10-12.5 MG PO TABS: 1.0000 | ORAL_TABLET | Freq: Every day | ORAL | 0 refills | Status: DC

## 2016-09-06 MED ORDER — HYDROCODONE-ACETAMINOPHEN 5-325 MG PO TABS: 1.0000 | ORAL_TABLET | ORAL | 0 refills | Status: DC | PRN

## 2016-09-06 NOTE — Discharge Instructions (Signed)
-  provided with one month prescription for high blood pressure -contact and follow up with Dr. Satira Anis on Monday -should you develop chest pain, numbness, weakness, change in vision or loss of feeling or movement on one side, please call 911 or present to the nearest ER -contact [facility] [phone]) or [facility] [phone]) on Monday for follow up appointment for foot fracture. -can use Tylenol or ibuprofen for mild to moderate pain -Norco one tablet q 4 hours as needed for severe pain -continue with ice, elevation -minimal weight baring until seen by ortho

## 2016-09-06 NOTE — ED Provider Notes (Signed)
CSN: [id number]     Arrival date & time 09/06/16  1023 History   First MD Initiated Contact with Patient 09/06/16 1104     Chief Complaint  Patient presents with  . Hypertension  . Foot Pain    Right   (Consider location/radiation/quality/duration/timing/severity/associated sxs/prior Treatment) Patient is a 66 year old female with a past history as noted below presented with complaint of hypertension and left foot pain. Patient states to ran out of her blood pressure medicines on March 28 and that her blood pressure has been elevated above her normal this week. Patient states that her pressures are normally in the low 130s over 90s but states the last few days her systolic pressures have been elevated into the 160s to 180s and that her blood pressure was in the 180s over low 100s this morning. Patient states that she was seen by Dr. Ubaldo Glassing at the end of February to initiate care as one of his new cardiac patients. Patient states that she had medication refills at the time of that appointment and that she would have medications available until after she had completed a stress test he was ready to adjust her medications. However, the patient missed her first stress test appointment and it was not done until yesterday. Patient states that she is also trying to get into see a new PCP was unable to get appointment until June 18. Patient does report recent shortness of breath which was the reason for her stress test and she does report some occasional chest pain but is unsure if it is actual chest pain or heartburn.  Patient states that she was walking today on an uneven pathway when she stumbled in her left foot inverting causing her to stumble and fall. Patient reports pain tenderness to touch, swelling, and bruising.      Past Medical History:  Diagnosis Date  . Anxiety   . Constipation   . Depression   . Diabetes mellitus without complication ([place])    borderline-no meds  . Hyperlipidemia   .  Hypertension   . Neuromuscular disorder ([place])    peripheral neuropathy   Past Surgical History:  Procedure Laterality Date  . COLONOSCOPY    . DILATION AND CURETTAGE OF UTERUS     x 5-6 1979-1982  . POLYPECTOMY     Family History  Problem Relation Age of Onset  . Colon cancer Mother   . Heart disease Father   . Esophageal cancer Neg Hx   . Rectal cancer Neg Hx   . Stomach cancer Neg Hx    Social History  Substance Use Topics  . Smoking status: Former Research scientist (life sciences)  . Smokeless tobacco: Never Used  . Alcohol use Yes     Comment: rare-2x a year   OB History    No data available     Review of Systems  Constitutional: Negative.   HENT: Negative.   Respiratory: Positive for shortness of breath. Negative for apnea, cough and choking.   Cardiovascular: Positive for chest pain.       Positive for hypertension, currently uncontrolled  Gastrointestinal: Negative for abdominal pain, constipation, diarrhea and nausea.  Genitourinary: Negative.   Musculoskeletal:       Pain, swelling, and discoloration to her left foot    Allergies  Penicillins and Erythromycin  Home Medications   Prior to Admission medications   Medication Sig Start Date End Date Taking? Authorizing Provider  diazepam (VALIUM) 5 MG tablet Take 2.5 mg by mouth daily.   Yes  Historical Provider, MD  gabapentin (NEURONTIN) 300 MG capsule Take 300 mg by mouth 3 (three) times daily. [phone] mg as needed   Yes Historical Provider, MD  POTASSIUM CHLORIDE PO Take 1 capsule by mouth daily.   Yes Historical Provider, MD  ciprofloxacin (CIPRO) 500 MG tablet Take 1 tablet (500 mg total) by mouth 2 (two) times daily. 08/03/15   Mihai Croitoru, MD  HYDROcodone-acetaminophen (NORCO/VICODIN) 5-325 MG tablet Take 1 tablet by mouth every 4 (four) hours as needed. 09/06/16   Luvenia Redden, PA-C  lisinopril-hydrochlorothiazide (PRINZIDE,ZESTORETIC) 10-12.5 MG tablet Take 1 tablet by mouth daily. 09/06/16   Luvenia Redden, PA-C   verapamil (CALAN-SR) 180 MG CR tablet Take 2 tablets (360 mg total) by mouth at bedtime. 09/06/16   Luvenia Redden, PA-C   Meds Ordered and Administered this Visit  Medications - No data to display  BP (!) 175/99 (BP Location: Left Arm)   Pulse 66   Temp 98.1 F (36.7 C) (Oral)   Resp 16   Ht 5\' 5"  (1.651 m)   Wt 190 lb (86.2 kg)   SpO2 98%   BMI 31.62 kg/m  No data found.   Physical Exam  Constitutional: She appears well-developed and well-nourished.  HENT:  Head: Normocephalic and atraumatic.  Eyes: Conjunctivae and EOM are normal. Pupils are equal, round, and reactive to light.  Neck: Normal range of motion. Neck supple.  Cardiovascular: Normal rate, regular rhythm and normal heart sounds.   Pulmonary/Chest: Effort normal and breath sounds normal.  Abdominal: Soft. Bowel sounds are normal.  Musculoskeletal:  Swelling and bruising to the lateral area of the left foot. Good distal cap refill. Pain to palpation along the lateral metatarsals    Urgent Care Course   Clinical Course as of Sep 07 1714  Sat Sep 06, 2016  1122 DG Foot Complete Left [MH]  1122 DG Foot Complete Left [MH]  1122 DG Foot Complete Left [MH]    Clinical Course User Index [MH] Luvenia Redden, PA-C    Procedures: None  Labs Review Labs Reviewed - No data to display  Imaging Review Dg Foot Complete Left  Result Date: 09/06/2016 CLINICAL DATA:  Tripped over concrete slab yesterday, lateral pain EXAM: LEFT FOOT - COMPLETE 3+ VIEW COMPARISON:  None. FINDINGS: Three views of the left foot submitted. There is nondisplaced transverse fracture at the base of fifth metatarsal. Mild soft tissue swelling dorsal metatarsal region. IMPRESSION: Nondisplaced transverse fracture at the base of fifth metatarsal. Electronically Signed   By: Lahoma Crocker M.D.   On: 09/06/2016 11:41    MDM   1. Essential hypertension   2. Closed nondisplaced fracture of fifth metatarsal bone of left foot, initial encounter     1. Patient is in the process to initiate care with a new cardiologist but ran out of her antihypertensive medications. She saw the cardiologist with the initial consultation but medication changes were not going to be made until after her stress test, which was done yesterday. Will provide a 1 months supply of her verapamil and lisinopril/HCTZ medications at the dosages as prescribed to her previous primary care provider. Recommended that the patient contact Dr. Bethanne Ginger office on Monday to schedule a follow-up.  2. Patient with a left foot fracture is noted in the imaging above. Left foot was placed into a walking boot. Patient was advised she can use Tylenol or ibuprofen for mild-to-moderate pain along with ice and elevation. Patient was given a prescription for 10  tablets of Norco for pain not improved with over-the-counter medications. Patient given contact information for both [facility] and [facility] for her to contact on Monday for follow-up care and management by orthopedics for her foot fracture.  Patient verbalized understanding and was in agreement with plans for both issues.  Luvenia Redden, PA-C      Luvenia Redden, PA-C 09/06/16 1721

## 2016-09-06 NOTE — ED Triage Notes (Signed)
Patient complains of hypertension- Patient states that she is transitioning to a new provider. Patient states that she was on Verapamil as well as lisinopril. Patient states that she had a stress test this week. Patient states that she fell yesterday, stepped wrong on the curb and landed on the concrete. Patient states that she has left top of her foot pain.

## 2017-03-20 ENCOUNTER — Encounter: Payer: Self-pay | Admitting: Hematology and Oncology

## 2017-03-20 ENCOUNTER — Inpatient Hospital Stay: Attending: Hematology and Oncology | Admitting: Hematology and Oncology

## 2017-03-20 ENCOUNTER — Encounter (INDEPENDENT_AMBULATORY_CARE_PROVIDER_SITE_OTHER): Payer: Self-pay

## 2017-03-20 ENCOUNTER — Inpatient Hospital Stay

## 2017-03-20 VITALS — BP 161/97 | HR 91 | Temp 97.7°F | Resp 20 | Ht 65.0 in | Wt 200.2 lb

## 2017-03-20 DIAGNOSIS — Z87891 Personal history of nicotine dependence: Secondary | ICD-10-CM | POA: Diagnosis not present

## 2017-03-20 DIAGNOSIS — D709 Neutropenia, unspecified: Secondary | ICD-10-CM | POA: Diagnosis not present

## 2017-03-20 DIAGNOSIS — Z79899 Other long term (current) drug therapy: Secondary | ICD-10-CM | POA: Diagnosis not present

## 2017-03-20 DIAGNOSIS — F418 Other specified anxiety disorders: Secondary | ICD-10-CM

## 2017-03-20 DIAGNOSIS — Z880 Allergy status to penicillin: Secondary | ICD-10-CM | POA: Insufficient documentation

## 2017-03-20 DIAGNOSIS — I10 Essential (primary) hypertension: Secondary | ICD-10-CM | POA: Diagnosis not present

## 2017-03-20 DIAGNOSIS — D72820 Lymphocytosis (symptomatic): Secondary | ICD-10-CM | POA: Insufficient documentation

## 2017-03-20 DIAGNOSIS — Z1239 Encounter for other screening for malignant neoplasm of breast: Secondary | ICD-10-CM

## 2017-03-20 DIAGNOSIS — E1140 Type 2 diabetes mellitus with diabetic neuropathy, unspecified: Secondary | ICD-10-CM | POA: Insufficient documentation

## 2017-03-20 DIAGNOSIS — E785 Hyperlipidemia, unspecified: Secondary | ICD-10-CM | POA: Insufficient documentation

## 2017-03-20 DIAGNOSIS — Z800 Family history of malignant neoplasm of digestive organs: Secondary | ICD-10-CM

## 2017-03-20 DIAGNOSIS — K5900 Constipation, unspecified: Secondary | ICD-10-CM | POA: Diagnosis not present

## 2017-03-20 DIAGNOSIS — Z7982 Long term (current) use of aspirin: Secondary | ICD-10-CM | POA: Diagnosis not present

## 2017-03-20 LAB — CBC WITH DIFFERENTIAL/PLATELET
BASOS PCT: 1 %
Basophils Absolute: 0 10*3/uL (ref 0–0.1)
EOS PCT: 2 %
Eosinophils Absolute: 0.1 10*3/uL (ref 0–0.7)
HEMATOCRIT: 41.5 % (ref 35.0–47.0)
Hemoglobin: 14.1 g/dL (ref 12.0–16.0)
Lymphocytes Relative: 54 %
Lymphs Abs: 2.6 10*3/uL (ref 1.0–3.6)
MCH: 30 pg (ref 26.0–34.0)
MCHC: 33.9 g/dL (ref 32.0–36.0)
MCV: 88.3 fL (ref 80.0–100.0)
MONO ABS: 0.5 10*3/uL (ref 0.2–0.9)
MONOS PCT: 10 %
Neutro Abs: 1.6 10*3/uL (ref 1.4–6.5)
Neutrophils Relative %: 33 %
Platelets: 261 10*3/uL (ref 150–440)
RBC: 4.7 MIL/uL (ref 3.80–5.20)
RDW: 14.4 % (ref 11.5–14.5)
WBC: 4.8 10*3/uL (ref 3.6–11.0)

## 2017-03-20 LAB — FOLATE: Folate: 11.2 ng/mL (ref >5.9)

## 2017-03-20 NOTE — Progress Notes (Signed)
Patient here today as new evaluation regarding neutropenia.  Referred by Lyla Glassing, NP. Patient is accompanied by her husband today.  States she does not know why she is here and is very anxious.

## 2017-03-20 NOTE — Progress Notes (Signed)
[facility] day:  03/20/2017  Chief Complaint: Tanya Pace is a 66 y.o. female with neutropenia who is referred in consultation by Stephens November, NP for assessment and management.  HPI:  The patient denies any problems with her blood counts.  She notes no history of recurrent infections or gingivitis.  She denies any autoimmune disease.  She was seen in the [facility] on 03/05/2017 for surveillance colonoscopy.  She was noted to have a history of neutropenia.  Repeat labs were drawn.  CBC on 11/04/2006 revealed a hematocrit of 32.5, hemoglobin 10.9, MCV 91.5, platelets 208,000, WBC 7800 with an ANC of 6700.  CBC on 11/10/2016 revealed a hematocrit of 38.9, hemoglobin 13.0, MCV 87, platelets 271,000, WBC 4500 with an and ANC of 1200.  Differential included 26% segs, 61% lymphs, 10% eosinophils, and 2% basophils.  Hepatitis C testing was negative on 11/10/2016. B12 was 484 on 11/10/2016.  TSH was normal on 11/10/2016.  CBC on 03/05/2017 revealed a hematocrit of 39.0, hemoglobin 12.9, MCV 29.9, platelets 274,000, WBC 4400 with an ANC of 1330.  Differential included 30% segs, 52% lymphs, 12% monocytes, 4% eosinophils, and 1% basophils.    Symptomatically, patient feels "good". Patient "catches her grandchildren's germs" causing frequent upper respiratory symptoms. She denies infections that have required antibiotics. She denies any history of hepatitis or HIV disease.  She has no bruising or bleeding. Patient has had some constipation that is relieved by "eating peanuts". Patient notes that she has gained weight recently. She states, "I started gaining weight after I broke my ankle". Weight was "179 pounds in June".  She is 200 pounds today. Patient is scheduled for a colonoscopy on 03/25/2017. Regarding her diet, she states, "It is not what it should be". Her diet is mostly "fried, dyed, and dipped inside". She craves sweets. She eats meat  and green leafy vegetables daily. Patient denies ice pica.   Patient denies any appreciable adenopathy.  Patient complains that she has intermittent "burning" in her LEFT thigh that no one has been able to explain. Patient notes that this pain requires the use of an ice pack to relieve. She is currently menopausal.  Patient states, "When will all of this heat stop? I have had menopause for a long time".   She has never had a mammogram before. Patient is concerned that they will "mash her breasts flat".  Patient cites that her daughter had a mammogram in the past and before the procedure her breasts were "big and perky". Following the mammogram, patient states, "my daughter's breasts are saggy and she went down sizes in her bra". Patient is concerned that the "same thing will happen to me". Husband attended the appointment with the patient today. He states, "The real reason that she won't do it is because they found a cyst (on the daughter's imaging) and had to do a biopsy. She is afraid that they are going to find something".   Patient denies the use of any new medication. She drinks Tumeric juice with ginger root, cinnamon, honey, and green tea to help with weight loss.    Past Medical History:  Diagnosis Date  . Anxiety   . Constipation   . Depression   . Diabetes mellitus without complication ([place])    borderline-no meds  . Hyperlipidemia   . Hypertension   . Neuromuscular disorder ([place])    peripheral neuropathy    Past Surgical History:  Procedure Laterality Date  .  COLONOSCOPY    . DILATION AND CURETTAGE OF UTERUS     x 5-6 1979-1982  . POLYPECTOMY      Family History  Problem Relation Age of Onset  . Colon cancer Mother   . Cancer Mother   . Heart disease Father   . Esophageal cancer Neg Hx   . Rectal cancer Neg Hx   . Stomach cancer Neg Hx     Social History:  reports that she quit smoking about 3 years ago. She has a 22.50 pack-year smoking history. She has never used  smokeless tobacco. She reports that she drinks alcohol. She reports that she does not use drugs. 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 She denies known history of familial cancer and blood dyscrasias. Patient drinks alcohol occasionally. Former 1.5 pack per day smoker for about 15-20 years. Stopped smoking on 12/07/2013 when her husband suffered a stroke. The patient is accompanied by her husband today.  Allergies:  Allergies  Allergen Reactions  . Penicillins Other (See Comments)    Gave yeast infection  . Erythromycin Nausea And Vomiting    Current Medications: Current Outpatient Prescriptions  Medication Sig Dispense Refill  . aspirin EC 81 MG tablet Take 81 mg by mouth daily.    . diazepam (VALIUM) 5 MG tablet Take 2.5 mg by mouth daily.    Marland Kitchen gabapentin (NEURONTIN) 300 MG capsule Take 300 mg by mouth 3 (three) times daily. [phone] mg as needed    . lisinopril-hydrochlorothiazide (PRINZIDE,ZESTORETIC) 10-12.5 MG tablet Take 1 tablet by mouth daily. 30 tablet 0  . POTASSIUM CHLORIDE PO Take 1 capsule by mouth daily.    . simvastatin (ZOCOR) 10 MG tablet Take 10 mg by mouth daily.  11  . verapamil (CALAN-SR) 180 MG CR tablet Take 2 tablets (360 mg total) by mouth at bedtime. 60 tablet 0   No current facility-administered medications for this visit.     Review of Systems:  GENERAL:  Feels good.  No fevers or sweats.  Weight gain of 21 pounds since 10/2016. PERFORMANCE STATUS (ECOG):  0 HEENT:  Runny nose.  No visual changes, sore throat, mouth sores or tenderness. Lungs: No shortness of breath or cough.  No hemoptysis. Cardiac:  No chest pain, palpitations, orthopnea, or PND. Breasts:  No prior mammogram (see HPI). GI:   Constipation.  No nausea, vomiting, diarrhea, melena or hematochezia.  Colonoscopy scheduled for 03/25/2017. GU:  No urgency, frequency, dysuria, or hematuria. Musculoskeletal:  Burning left thigh (see HPI).   No back pain.  Arthritis.  No muscle tenderness. Extremities:  No pain or swelling. Skin:  No rashes or skin changes. Neuro:  Migraines secondary to "lack of coffee or aggravation".  No numbness or weakness, balance or coordination issues. Endocrine:  No diabetes, thyroid issues, hot flashes or night sweats. Psych:  No mood changes, depression or anxiety. Pain:  No focal pain. Review of systems:  All other systems reviewed and found to be negative.  Physical Exam: Blood pressure (!) 161/97, pulse 91, temperature 97.7 F (36.5 C), temperature source Tympanic, resp. rate 20, height 5\' 5"  (1.651 m), weight 200 lb 4 oz (90.8 kg). GENERAL:  Well developed, well nourished, woman sitting comfortably in the exam room in no acute distress. MENTAL STATUS:  Alert and oriented to person, place and time. HEAD:  Short black/blue and white hair (wig).  Normocephalic, atraumatic, face symmetric, no Cushingoid features. EYES:  Glasses.  Brown eyes.  Arcus  senilis.  Pupils equal round and reactive to light and accomodation.  No conjunctivitis or scleral icterus. ENT:  Oropharynx clear without lesion.  Tongue normal. Mucous membranes moist.  RESPIRATORY:  Clear to auscultation without rales, wheezes or rhonchi. CARDIOVASCULAR:  Regular rate and rhythm without murmur, rub or gallop. ABDOMEN:  Soft, non-tender, with active bowel sounds, and no appreciable hepatosplenomegaly.  No masses. SKIN:  No rashes, ulcers or lesions. EXTREMITIES: No edema, no skin discoloration or tenderness.  No palpable cords. LYMPH NODES: No palpable cervical, supraclavicular, axillary or inguinal adenopathy  NEUROLOGICAL: Unremarkable. PSYCH:  Appropriate.   No visits with results within 3 Day(s) from this visit.  Latest known visit with results is:  Office Visit on 08/03/2015  Component Date Value Ref Range Status  . Sodium 08/03/2015 138  135 - 146 mmol/L Final  . Potassium 08/03/2015 4.1  3.5 - 5.3 mmol/L Final  . Chloride  08/03/2015 99  98 - 110 mmol/L Final  . CO2 08/03/2015 32* 20 - 31 mmol/L Final  . Glucose, Bld 08/03/2015 101* 65 - 99 mg/dL Final  . BUN 08/03/2015 18  7 - 25 mg/dL Final  . Creat 08/03/2015 1.03* 0.50 - 0.99 mg/dL Final  . Total Bilirubin 08/03/2015 0.3  0.2 - 1.2 mg/dL Final  . Alkaline Phosphatase 08/03/2015 75  33 - 130 U/L Final  . AST 08/03/2015 20  10 - 35 U/L Final  . ALT 08/03/2015 17  6 - 29 U/L Final  . Total Protein 08/03/2015 7.3  6.1 - 8.1 g/dL Final  . Albumin 08/03/2015 4.0  3.6 - 5.1 g/dL Final  . Calcium 08/03/2015 9.5  8.6 - 10.4 mg/dL Final  . Color, Urine 08/03/2015 YELLOW  YELLOW Final   Comment: ** Please note change in unit of measure and reference range(s). **     . APPearance 08/03/2015 CLEAR  CLEAR Final  . Specific Gravity, Urine 08/03/2015 1.022  1.001 - 1.035 Final  . pH 08/03/2015 6.0  5.0 - 8.0 Final  . Glucose, UA 08/03/2015 NEGATIVE  NEGATIVE Final  . Bilirubin Urine 08/03/2015 NEGATIVE  NEGATIVE Final  . Ketones, ur 08/03/2015 NEGATIVE  NEGATIVE Final  . Hgb urine dipstick 08/03/2015 NEGATIVE  NEGATIVE Final  . Protein, ur 08/03/2015 NEGATIVE  NEGATIVE Final  . Nitrite 08/03/2015 NEGATIVE  NEGATIVE Final  . Leukocytes, UA 08/03/2015 NEGATIVE  NEGATIVE Final  . WBC, UA 08/03/2015 0-5  <=5 WBC/HPF Final  . RBC / HPF 08/03/2015 0-2  <=2 RBC/HPF Final  . Squamous Epithelial / LPF 08/03/2015 0-5  <=5 HPF Final  . Bacteria, UA 08/03/2015 NONE SEEN  NONE SEEN HPF Final  . Crystals 08/03/2015 NONE SEEN  NONE SEEN HPF Final  . Casts 08/03/2015 NONE SEEN  NONE SEEN LPF Final  . Yeast 08/03/2015 NONE SEEN  NONE SEEN HPF Final    Assessment:  Kaylean K Kanode is a 66 y.o. female with mild neutropenia x 4 months.  She has moderate lymphocytosis.  She has no history of recurrent infections or gingivitis.  She denies any autoimmune disease.  Diet appears good.  She is on no herbal products or new medications.  She denies any history of hepatitis or HIV  disease.  She has a history of polyps.  Colonoscopy is scheduled for 03/25/2017.  She has never had a mammogram.  She denies any B symptoms.  Exam reveals no adenopathy or hepatosplenomegaly.  Plan: 1.  Discuss outside labs revealing a normal WBC with mild neutropenia (ANC <  1500) in the past 4 months.  Hematocrit and hemoglobin are normal.  Review differential diagnosis including medications, viral infections (hepatitis B and C, HIV), nutritional deficiencies (B12, folate, copper), and autoimmune.  Discuss lymphocytosis and possible CLL. 2.  Labs today:  CBC with diff, B12, folate, copper, ANA with reflex, flow cytometry, HBV surface Ag, HBV core Ab, HCV Ab, HIV Ab.  Discuss adding HIV and hepatitis testing to assess for viral etiology of her neutropenia.  Patient consented to HIV testing and other labs.  3.  Schedule screening mammogram next week. Will tentatively placed order for 03/27/2017. 4.  RTC in 1 week for MD assessment and to review labs.   Honor Loh, NP  03/20/2017, 3:39 PM   I saw and evaluated the patient, participating in the key portions of the service and reviewing pertinent diagnostic studies and records.  I reviewed the nurse practitioner's note and agree with the findings and the plan.  The assessment and plan were discussed with the patient.  A few questions were asked by the patient and answered.   Nolon Stalls, MD 03/20/2017, 3:39 PM

## 2017-03-21 LAB — HEPATITIS B SURFACE ANTIGEN: Hepatitis B Surface Ag: NEGATIVE

## 2017-03-21 LAB — VITAMIN B12: VITAMIN B 12: 537 pg/mL (ref 180–914)

## 2017-03-24 ENCOUNTER — Encounter: Payer: Self-pay | Admitting: *Deleted

## 2017-03-24 LAB — COMP PANEL: LEUKEMIA/LYMPHOMA

## 2017-03-24 LAB — HEPATITIS C ANTIBODY: HCV Ab: <0.1 s/co ratio (ref 0.0–0.9)

## 2017-03-24 LAB — HEPATITIS B CORE ANTIBODY, TOTAL: Hep B Core Total Ab: POSITIVE — AB

## 2017-03-24 LAB — HIV ANTIBODY (ROUTINE TESTING W REFLEX): HIV Screen 4th Generation wRfx: NONREACTIVE

## 2017-03-24 LAB — ANA W/REFLEX: ANA: NEGATIVE

## 2017-03-24 LAB — COPPER, SERUM: COPPER: 137 ug/dL (ref 72–166)

## 2017-03-25 ENCOUNTER — Encounter: Payer: Self-pay | Admitting: *Deleted

## 2017-03-25 ENCOUNTER — Ambulatory Visit: Admitting: Anesthesiology

## 2017-03-25 ENCOUNTER — Ambulatory Visit
Admission: RE | Admit: 2017-03-25 | Discharge: 2017-03-25 | Disposition: A | Discharge status: Home / Self Care | Source: Ambulatory Visit | Attending: Internal Medicine | Admitting: Internal Medicine

## 2017-03-25 ENCOUNTER — Encounter
Admission: RE | Disposition: A | Discharge status: Home / Self Care | Payer: Self-pay | Source: Ambulatory Visit | Attending: Internal Medicine

## 2017-03-25 DIAGNOSIS — Z79899 Other long term (current) drug therapy: Secondary | ICD-10-CM | POA: Diagnosis not present

## 2017-03-25 DIAGNOSIS — Z881 Allergy status to other antibiotic agents status: Secondary | ICD-10-CM | POA: Diagnosis not present

## 2017-03-25 DIAGNOSIS — F419 Anxiety disorder, unspecified: Secondary | ICD-10-CM | POA: Diagnosis not present

## 2017-03-25 DIAGNOSIS — Z800 Family history of malignant neoplasm of digestive organs: Secondary | ICD-10-CM | POA: Diagnosis not present

## 2017-03-25 DIAGNOSIS — I10 Essential (primary) hypertension: Secondary | ICD-10-CM | POA: Diagnosis not present

## 2017-03-25 DIAGNOSIS — Z86010 Personal history of colonic polyps: Secondary | ICD-10-CM | POA: Diagnosis not present

## 2017-03-25 DIAGNOSIS — Z6833 Body mass index (BMI) 33.0-33.9, adult: Secondary | ICD-10-CM | POA: Diagnosis not present

## 2017-03-25 DIAGNOSIS — I2510 Atherosclerotic heart disease of native coronary artery without angina pectoris: Secondary | ICD-10-CM | POA: Diagnosis not present

## 2017-03-25 DIAGNOSIS — Z7982 Long term (current) use of aspirin: Secondary | ICD-10-CM | POA: Insufficient documentation

## 2017-03-25 DIAGNOSIS — G629 Polyneuropathy, unspecified: Secondary | ICD-10-CM | POA: Insufficient documentation

## 2017-03-25 DIAGNOSIS — F329 Major depressive disorder, single episode, unspecified: Secondary | ICD-10-CM | POA: Insufficient documentation

## 2017-03-25 DIAGNOSIS — E785 Hyperlipidemia, unspecified: Secondary | ICD-10-CM | POA: Diagnosis not present

## 2017-03-25 DIAGNOSIS — Z87891 Personal history of nicotine dependence: Secondary | ICD-10-CM | POA: Diagnosis not present

## 2017-03-25 DIAGNOSIS — I739 Peripheral vascular disease, unspecified: Secondary | ICD-10-CM | POA: Insufficient documentation

## 2017-03-25 DIAGNOSIS — R7303 Prediabetes: Secondary | ICD-10-CM | POA: Insufficient documentation

## 2017-03-25 DIAGNOSIS — Z1211 Encounter for screening for malignant neoplasm of colon: Secondary | ICD-10-CM | POA: Insufficient documentation

## 2017-03-25 DIAGNOSIS — K640 First degree hemorrhoids: Secondary | ICD-10-CM | POA: Insufficient documentation

## 2017-03-25 DIAGNOSIS — Z880 Allergy status to penicillin: Secondary | ICD-10-CM | POA: Diagnosis not present

## 2017-03-25 HISTORY — PX: COLONOSCOPY WITH PROPOFOL: SHX5780

## 2017-03-25 HISTORY — DX: Prediabetes: R73.03

## 2017-03-25 HISTORY — DX: Atherosclerotic heart disease of native coronary artery without angina pectoris: I25.10

## 2017-03-25 SURGERY — COLONOSCOPY WITH PROPOFOL
Anesthesia: General

## 2017-03-25 MED ORDER — LIDOCAINE 2% (20 MG/ML) 5 ML SYRINGE
INTRAMUSCULAR | Status: DC | PRN
  Administered 2017-03-25: 40 mg via INTRAVENOUS

## 2017-03-25 MED ORDER — SODIUM CHLORIDE 0.9 % IV SOLN
INTRAVENOUS | Status: DC
  Administered 2017-03-25 (×2): via INTRAVENOUS

## 2017-03-25 MED ORDER — FENTANYL CITRATE (PF) 100 MCG/2ML IJ SOLN
INTRAMUSCULAR | Status: AC
  Filled 2017-03-25: qty 2

## 2017-03-25 MED ORDER — FENTANYL CITRATE (PF) 100 MCG/2ML IJ SOLN
INTRAMUSCULAR | Status: DC | PRN
  Administered 2017-03-25: 50 ug via INTRAVENOUS

## 2017-03-25 MED ORDER — PROPOFOL 500 MG/50ML IV EMUL
INTRAVENOUS | Status: DC | PRN
  Administered 2017-03-25: 150 ug/kg/min via INTRAVENOUS

## 2017-03-25 MED ORDER — PROPOFOL 10 MG/ML IV BOLUS
INTRAVENOUS | Status: DC | PRN
  Administered 2017-03-25: 100 mg via INTRAVENOUS

## 2017-03-25 NOTE — Op Note (Signed)
[facility] Gastroenterology Patient Name: Tanya Pace Procedure Date: 03/25/2017 11:13 AM MRN: [id number] Account #: [id number] Date of Birth: 10-Nov-1950 Admit Type: Outpatient Age: 66 Room: [facility] ENDO ROOM 4 Gender: Female Note Status: Finalized Procedure:            Colonoscopy Indications:          High risk colon cancer surveillance: Personal history                        of colonic polyps Providers:            Benay Pike. Alice Reichert MD, MD Referring MD:         Ane Payment, MD (Referring MD) Complications:        No immediate complications. Procedure:            Pre-Anesthesia Assessment:                       - The risks and benefits of the procedure and the                        sedation options and risks were discussed with the                        patient. All questions were answered and informed                        consent was obtained.                       - Patient identification and proposed procedure were                        verified prior to the procedure by the nurse. The                        procedure was verified in the procedure room.                       - ASA Grade Assessment: III - A patient with severe                        systemic disease.                       - After reviewing the risks and benefits, the patient                        was deemed in satisfactory condition to undergo the                        procedure.                       After obtaining informed consent, the colonoscope was                        passed under direct vision. Throughout the procedure,                        the patient's blood pressure, pulse, and oxygen  saturations were monitored continuously. The                        Colonoscope was introduced through the anus and                        advanced to the the cecum, identified by appendiceal                        orifice and ileocecal valve. The  colonoscopy was                        performed without difficulty. The patient tolerated the                        procedure well. The quality of the bowel preparation                        was adequate. Findings:      The perianal and digital rectal examinations were normal. Pertinent       negatives include normal sphincter tone.      The colon (entire examined portion) appeared normal.      Non-bleeding internal hemorrhoids were found during retroflexion. The       hemorrhoids were small and Grade I (internal hemorrhoids that do not       prolapse). Impression:           - The entire examined colon is normal.                       - Non-bleeding internal hemorrhoids.                       - No specimens collected. Recommendation:       - Patient has a contact number available for                        emergencies. The signs and symptoms of potential                        delayed complications were discussed with the patient.                        Return to normal activities tomorrow. Written discharge                        instructions were provided to the patient.                       - Resume previous diet.                       - Continue present medications.                       - Repeat colonoscopy in 10 years for screening purposes.                       - Return to GI office PRN.                       - The findings and recommendations  were discussed with                        the patient and their spouse. Procedure Code(s):    --- Professional ---                       K8003, Colorectal cancer screening; colonoscopy on                        individual at high risk Diagnosis Code(s):    --- Professional ---                       Z86.010, Personal history of colonic polyps                       K64.0, First degree hemorrhoids CPT copyright 2016 American Medical Association. All rights reserved. The codes documented in this report are preliminary and upon coder  review may  be revised to meet current compliance requirements. Efrain Sella MD, MD 03/25/2017 11:41:19 AM This report has been signed electronically. Number of Addenda: 0 Note Initiated On: 03/25/2017 11:13 AM Scope Withdrawal Time: 0 hours 6 minutes 12 seconds  Total Procedure Duration: 0 hours 12 minutes 20 seconds       [facility]

## 2017-03-25 NOTE — Anesthesia Post-op Follow-up Note (Signed)
Anesthesia QCDR form completed.        

## 2017-03-25 NOTE — Anesthesia Preprocedure Evaluation (Signed)
Anesthesia Evaluation  Patient identified by MRN, date of birth, ID band Patient awake    Reviewed: Allergy & Precautions, NPO status , Patient's Chart, lab work & pertinent test results  Airway Mallampati: II       Dental  (+) Teeth Intact   Pulmonary neg pulmonary ROS, former smoker,     + decreased breath sounds      Cardiovascular Exercise Tolerance: Good hypertension, Pt. on medications + CAD and + Peripheral Vascular Disease   Rhythm:Regular     Neuro/Psych Anxiety Depression    GI/Hepatic negative GI ROS,   Endo/Other  Morbid obesity  Renal/GU negative Renal ROS     Musculoskeletal   Abdominal (+) + obese,   Peds negative pediatric ROS (+)  Hematology negative hematology ROS (+)   Anesthesia Other Findings   Reproductive/Obstetrics                             Anesthesia Physical Anesthesia Plan  ASA: II  Anesthesia Plan: General   Post-op Pain Management:    Induction: Intravenous  PONV Risk Score and Plan: 0  Airway Management Planned: Natural Airway and Nasal Cannula  Additional Equipment:   Intra-op Plan:   Post-operative Plan:   Informed Consent: I have reviewed the patients History and Physical, chart, labs and discussed the procedure including the risks, benefits and alternatives for the proposed anesthesia with the patient or authorized representative who has indicated his/her understanding and acceptance.     Plan Discussed with: CRNA  Anesthesia Plan Comments:         Anesthesia Quick Evaluation

## 2017-03-25 NOTE — Anesthesia Postprocedure Evaluation (Signed)
Anesthesia Post Note  Patient: Tanya Pace  Procedure(s) Performed: COLONOSCOPY WITH PROPOFOL (N/A )  Patient location during evaluation: PACU Anesthesia Type: General Level of consciousness: awake Pain management: pain level controlled Vital Signs Assessment: post-procedure vital signs reviewed and stable Respiratory status: spontaneous breathing Cardiovascular status: stable Anesthetic complications: no     Last Vitals:  Vitals:   03/25/17 1204 03/25/17 1214  BP: (!) 166/93 (!) 177/87  Pulse: (!) 59 60  Resp: 19 17  Temp:    SpO2: 100% 99%    Last Pain:  Vitals:   03/25/17 1144  TempSrc: Tympanic  PainSc:                  VAN STAVEREN,[name]

## 2017-03-25 NOTE — Transfer of Care (Signed)
Immediate Anesthesia Transfer of Care Note  Patient: Tanya Pace  Procedure(s) Performed: COLONOSCOPY WITH PROPOFOL (N/A )  Patient Location: PACU and Endoscopy Unit  Anesthesia Type:General  Level of Consciousness: sedated  Airway & Oxygen Therapy: Patient Spontanous Breathing and Patient connected to nasal cannula oxygen  Post-op Assessment: Report given to RN and Post -op Vital signs reviewed and stable  Post vital signs: Reviewed and stable  Last Vitals:  Vitals:   03/25/17 0947  BP: (!) 154/103  Pulse: 72  Resp: 20  Temp: (!) 36.1 C  SpO2: 98%    Last Pain:  Vitals:   03/25/17 0947  TempSrc: Tympanic  PainSc: 0-No pain      Patients Stated Pain Goal: 0 (07/62/26 3335)  Complications: No apparent anesthesia complications

## 2017-03-25 NOTE — Interval H&P Note (Signed)
History and Physical Interval Note:  03/25/2017 11:14 AM  Tanya Pace  has presented today for surgery, with the diagnosis of F H COLON CA P H ADENO POLYPS  The various methods of treatment have been discussed with the patient and family. After consideration of risks, benefits and other options for treatment, the patient has consented to  Procedure(s): COLONOSCOPY WITH PROPOFOL (N/A) as a surgical intervention .  The patient's history has been reviewed, patient examined, no change in status, stable for surgery.  I have reviewed the patient's chart and labs.  Questions were answered to the patient's satisfaction.     [place], [place]

## 2017-03-25 NOTE — H&P (Signed)
Outpatient short stay form Pre-procedure 03/25/2017 11:13 AM Aviyah Swetz K. Alice Reichert, M.D.  Primary Physician: Barbaraann Boys, M.D.  Reason for visit:  Personal hx of adenoma of the colon, Family hx of colon cancer - Mother  History of present illness:  Denies abdominal pain, change in bowel habits or rectal bleeding.    Current Facility-Administered Medications:  .  0.9 %  sodium chloride infusion, , Intravenous, Continuous, Kountze, Benay Pike, MD, Last Rate: 20 mL/hr at 03/25/17 1015  Prescriptions Prior to Admission  Medication Sig Dispense Refill Last Dose  . aspirin EC 81 MG tablet Take 81 mg by mouth daily.   03/24/2017 at Unknown time  . diazepam (VALIUM) 5 MG tablet Take 2.5 mg by mouth daily.   03/25/2017 at 530  . gabapentin (NEURONTIN) 300 MG capsule Take 300 mg by mouth 3 (three) times daily. [phone] mg as needed   Past Week at Unknown time  . lisinopril-hydrochlorothiazide (PRINZIDE,ZESTORETIC) 10-12.5 MG tablet Take 1 tablet by mouth daily. 30 tablet 0 03/25/2017 at Unknown time  . POTASSIUM CHLORIDE PO Take 1 capsule by mouth daily.   03/25/2017 at Unknown time  . simvastatin (ZOCOR) 10 MG tablet Take 10 mg by mouth daily.  11 03/24/2017 at Unknown time  . verapamil (CALAN-SR) 180 MG CR tablet Take 2 tablets (360 mg total) by mouth at bedtime. 60 tablet 0 03/24/2017 at 530Unknown time     Allergies  Allergen Reactions  . Penicillins Other (See Comments)    Gave yeast infection  . Erythromycin Nausea And Vomiting     Past Medical History:  Diagnosis Date  . Anxiety   . Constipation   . Coronary artery disease   . Depression   . Hyperlipidemia   . Hypertension   . Neuromuscular disorder ([place])    peripheral neuropathy  . Pre-diabetes     Review of systems:      Physical Exam  General appearance: alert, cooperative and appears stated age Resp: clear to auscultation bilaterally Cardio: regular rate and rhythm, S1, S2 normal, no murmur, click, rub or  gallop GI: soft, non-tender; bowel sounds normal; no masses,  no organomegaly     Planned procedures: Colonoscopy. The patient understands the nature of the planned procedure, indications, risks, alternatives and potential complications including but not limited to bleeding, infection, perforation, damage to internal organs and possible oversedation/side effects from anesthesia. The patient agrees and gives consent to proceed.  Please refer to procedure notes for findings, recommendations and patient disposition/instructions.    Beauford Lando K. Alice Reichert, M.D. Gastroenterology 03/25/2017  11:13 AM

## 2017-03-27 ENCOUNTER — Encounter: Payer: Self-pay | Admitting: Internal Medicine

## 2017-03-30 ENCOUNTER — Encounter: Payer: Self-pay | Admitting: *Deleted

## 2017-03-30 ENCOUNTER — Ambulatory Visit
Admission: EM | Admit: 2017-03-30 | Discharge: 2017-03-30 | Disposition: A | Discharge status: Home / Self Care | Attending: Family Medicine | Admitting: Family Medicine

## 2017-03-30 DIAGNOSIS — I10 Essential (primary) hypertension: Secondary | ICD-10-CM

## 2017-03-30 NOTE — ED Triage Notes (Signed)
Pt noticed last week her BP was high 173/110. She states she has been taking her BP medications but her BP is still high.

## 2017-03-30 NOTE — ED Provider Notes (Signed)
[facility]    CSN: [id number] Arrival date & time: 03/30/17  1444     History   Chief Complaint Chief Complaint  Patient presents with  . Hypertension    HPI Tanya Pace is a 66 y.o. female.   C/o high blood pressure readings at home, despite taking blood pressure medication. Denies any chest pains, shortness of breath, headaches, vision changes, numbness/tingling.     Hypertension     Past Medical History:  Diagnosis Date  . Anxiety   . Constipation   . Coronary artery disease   . Depression   . Hyperlipidemia   . Hypertension   . Neuromuscular disorder ([place])    peripheral neuropathy  . Pre-diabetes     Patient Active Problem List   Diagnosis Date Noted  . Essential hypertension 04/05/2015  . Hyperlipidemia 04/05/2015  . Raynaud's syndrome 04/05/2015  . Heart palpitations 04/05/2015  . Sinus bradycardia 04/05/2015    Past Surgical History:  Procedure Laterality Date  . COLONOSCOPY    . DILATION AND CURETTAGE OF UTERUS     x 5-6 1979-1982  . POLYPECTOMY      OB History    No data available       Home Medications    Prior to Admission medications   Medication Sig Start Date End Date Taking? Authorizing Provider  aspirin EC 81 MG tablet Take 81 mg by mouth daily.   Yes [provider]  diazepam (VALIUM) 5 MG tablet Take 2.5 mg by mouth daily.   Yes [provider]  gabapentin (NEURONTIN) 300 MG capsule Take 300 mg by mouth 3 (three) times daily. [phone] mg as needed   Yes [provider]  lisinopril-hydrochlorothiazide (PRINZIDE,ZESTORETIC) 10-12.5 MG tablet Take 1 tablet by mouth daily. 09/06/16  Yes Luvenia Redden, PA-C  POTASSIUM CHLORIDE PO Take 1 capsule by mouth daily.   Yes [provider]  simvastatin (ZOCOR) 10 MG tablet Take 10 mg by mouth daily. 12/22/16  Yes [provider]  verapamil (CALAN-SR) 180 MG CR tablet Take 2 tablets (360 mg total) by mouth at bedtime.  09/06/16  Yes Luvenia Redden, PA-C    Family History Family History  Problem Relation Age of Onset  . Colon cancer Mother   . Cancer Mother   . Heart disease Father   . Esophageal cancer Neg Hx   . Rectal cancer Neg Hx   . Stomach cancer Neg Hx     Social History Social History   Tobacco Use  . Smoking status: Former Smoker    Packs/day: 1.50    Years: 15.00    Pack years: 22.50    Last attempt to quit: 12/07/2013    Years since quitting: 3.3  . Smokeless tobacco: Never Used  Substance Use Topics  . Alcohol use: Yes    Comment: rare-2x a year  . Drug use: No     Allergies   Penicillins and Erythromycin   Review of Systems Review of Systems   Physical Exam Triage Vital Signs ED Triage Vitals  Enc Vitals Group     BP 03/30/17 1543 (!) 161/95     Pulse Rate 03/30/17 1543 85     Resp 03/30/17 1543 14     Temp 03/30/17 1543 98.3 F (36.8 C)     Temp Source 03/30/17 1543 Oral     SpO2 03/30/17 1543 98 %     Weight 03/30/17 1544 200 lb (90.7 kg)     Height  03/30/17 1544 5\' 5"  (1.651 m)     Head Circumference --      Peak Flow --      Pain Score --      Pain Loc --      Pain Edu? --      Excl. in [place]? --    No data found.  Updated Vital Signs BP (!) 161/95 (BP Location: Left Arm)   Pulse 85   Temp 98.3 F (36.8 C) (Oral)   Resp 14   Ht 5\' 5"  (1.651 m)   Wt 200 lb (90.7 kg)   SpO2 98%   BMI 33.28 kg/m   Visual Acuity Right Eye Distance:   Left Eye Distance:   Bilateral Distance:    Right Eye Near:   Left Eye Near:    Bilateral Near:     Physical Exam  Constitutional: She appears well-developed and well-nourished. No distress.  HENT:  Head: Normocephalic.  Right Ear: Tympanic membrane and ear canal normal.  Left Ear: Tympanic membrane and ear canal normal.  Mouth/Throat: Uvula is midline and mucous membranes are normal.  Cardiovascular: Normal rate, regular rhythm and normal heart sounds.  Pulmonary/Chest: Effort normal and breath  sounds normal. No respiratory distress. She has no wheezes. She has no rales.  Skin: She is not diaphoretic.  Nursing note and vitals reviewed.    UC Treatments / Results  Labs (all labs ordered are listed, but only abnormal results are displayed) Labs Reviewed - No data to display  EKG  EKG Interpretation None       Radiology No results found.  Procedures Procedures (including critical care time)  Medications Ordered in UC Medications - No data to display   Initial Impression / Assessment and Plan / UC Course  I have reviewed the triage vital signs and the nursing notes.  Pertinent labs & imaging results that were available during my care of the patient were reviewed by me and considered in my medical decision making (see chart for details).      Final Clinical Impressions(s) / UC Diagnoses   Final diagnoses:  Essential hypertension    New Prescriptions This SmartLink is deprecated. Use AVSMEDLIST instead to display the medication list for a patient.  1. diagnosis reviewed with patient 2. Recommend increasing lisinopril/hctz tab to 2 per day 3. Follow-up with PCP next week for recheck and labs   Controlled Substance Prescriptions [place] Controlled Substance Registry consulted? Not Applicable   Norval Gable, MD 03/30/17 [phone]

## 2017-03-30 NOTE — Discharge Instructions (Signed)
Increase lisinopril/HCTZ tab to 2 tablets per day

## 2017-03-31 ENCOUNTER — Inpatient Hospital Stay: Admitting: Hematology and Oncology

## 2017-04-06 ENCOUNTER — Inpatient Hospital Stay: Attending: Hematology and Oncology | Admitting: Hematology and Oncology

## 2017-04-06 ENCOUNTER — Encounter: Payer: Self-pay | Admitting: Hematology and Oncology

## 2017-04-06 VITALS — BP 159/100 | HR 102 | Temp 97.6°F | Resp 14 | Wt 200.0 lb

## 2017-04-06 DIAGNOSIS — Z800 Family history of malignant neoplasm of digestive organs: Secondary | ICD-10-CM | POA: Diagnosis not present

## 2017-04-06 DIAGNOSIS — I2510 Atherosclerotic heart disease of native coronary artery without angina pectoris: Secondary | ICD-10-CM | POA: Insufficient documentation

## 2017-04-06 DIAGNOSIS — Z87891 Personal history of nicotine dependence: Secondary | ICD-10-CM | POA: Diagnosis not present

## 2017-04-06 DIAGNOSIS — Z7982 Long term (current) use of aspirin: Secondary | ICD-10-CM | POA: Diagnosis not present

## 2017-04-06 DIAGNOSIS — R7303 Prediabetes: Secondary | ICD-10-CM | POA: Diagnosis not present

## 2017-04-06 DIAGNOSIS — I10 Essential (primary) hypertension: Secondary | ICD-10-CM | POA: Diagnosis not present

## 2017-04-06 DIAGNOSIS — Z79899 Other long term (current) drug therapy: Secondary | ICD-10-CM | POA: Insufficient documentation

## 2017-04-06 DIAGNOSIS — G629 Polyneuropathy, unspecified: Secondary | ICD-10-CM | POA: Diagnosis not present

## 2017-04-06 DIAGNOSIS — D72820 Lymphocytosis (symptomatic): Secondary | ICD-10-CM | POA: Insufficient documentation

## 2017-04-06 DIAGNOSIS — D709 Neutropenia, unspecified: Secondary | ICD-10-CM | POA: Diagnosis present

## 2017-04-06 DIAGNOSIS — Z880 Allergy status to penicillin: Secondary | ICD-10-CM | POA: Insufficient documentation

## 2017-04-06 DIAGNOSIS — E785 Hyperlipidemia, unspecified: Secondary | ICD-10-CM | POA: Diagnosis not present

## 2017-04-06 DIAGNOSIS — D72819 Decreased white blood cell count, unspecified: Secondary | ICD-10-CM | POA: Insufficient documentation

## 2017-04-06 NOTE — Progress Notes (Signed)
[facility] day:  04/06/2017  Chief Complaint: Tanya Pace is a 66 y.o. female with neutropenia who is seen for review of work-up and discussion regarding direction of therapy.  HPI:  The patient was last seen in the hematology clinic on 03/20/2017.  At that time, she was seen for initial consultation.  She had mild neutropenia x 4 months.  She had moderate lymphocytosis.  She had no history of recurrent infections or gingivitis.  She denied any autoimmune disease.  Diet appeared good.  She was on no herbal products or new medications.  She denied any history of hepatitis or HIV disease.  She underwent a work-up.  CBC revealed a hematocrit of 41.5, hemoglobin 14.1, platelets 261,000, white count 4800 with an ANC of 1600. Differential included 33% segs, 54% lymphs, 10% monocytes, 2% eosinophils and 1% basophils.  B12 was 537. Folate was 11.2.  Copper was 137.  ANA was negative.    Flow cytometry revealed no diagnostic immunophenotypic abnormality. T cell gene rearrangement studies could be performed in the future if needed to assess for a clonal T-cell process. CD57+ cells were increased but consisted of a mixture of CD4 and CD8+ T cells and NK cells and thus clinically not felt to be LGL leukemia.  There were no blasts.  She underwent colonoscopy on 03/25/2017 by Dr. Alice Reichert.  The entire colon was normal.  She has not had her mammogram.  During the interim, patient is doing ok. She notes that her blood pressure has been elevated. She went to the urgent care this weekend and was told to take 2 doses of her Lisinopril. Patient denies acute concerns.    Past Medical History:  Diagnosis Date  . Anxiety   . Constipation   . Coronary artery disease   . Depression   . Hyperlipidemia   . Hypertension   . Neuromuscular disorder ([place])    peripheral neuropathy  . Pre-diabetes     Past Surgical History:  Procedure Laterality Date  . COLONOSCOPY     . DILATION AND CURETTAGE OF UTERUS     x 5-6 1979-1982  . POLYPECTOMY      Family History  Problem Relation Age of Onset  . Colon cancer Mother   . Cancer Mother   . Heart disease Father   . Esophageal cancer Neg Hx   . Rectal cancer Neg Hx   . Stomach cancer Neg Hx     Social History:  reports that she quit smoking about 3 years ago. She has a 22.50 pack-year smoking history. she has never used smokeless tobacco. She reports that she drinks alcohol. She reports that she does not use drugs. 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 She denies known history of familial cancer and blood dyscrasias. Patient drinks alcohol occasionally. Former 1.5 pack per day smoker for about 15-20 years. She stopped smoking on 12/07/2013 when her husband suffered a stroke. The patient is accompanied by her husband Tanya Pace) today.  Allergies:  Allergies  Allergen Reactions  . Penicillins Other (See Comments)    Gave yeast infection  . Erythromycin Nausea And Vomiting    Current Medications: Current Outpatient Medications  Medication Sig Dispense Refill  . aspirin EC 81 MG tablet Take 81 mg by mouth daily.    . diazepam (VALIUM) 5 MG tablet Take 2.5 mg by mouth daily.    Marland Kitchen gabapentin (NEURONTIN) 300 MG capsule Take 300  mg by mouth 3 (three) times daily. [phone] mg as needed    . lisinopril-hydrochlorothiazide (PRINZIDE,ZESTORETIC) 10-12.5 MG tablet Take 1 tablet by mouth daily. (Patient taking differently: Take 1 tablet 2 (two) times daily by mouth. ) 30 tablet 0  . POTASSIUM CHLORIDE PO Take 1 capsule 2 (two) times daily by mouth.     . simvastatin (ZOCOR) 10 MG tablet Take 10 mg by mouth daily.  11  . verapamil (CALAN-SR) 180 MG CR tablet Take 2 tablets (360 mg total) by mouth at bedtime. 60 tablet 0   No current facility-administered medications for this visit.     Review of Systems:  GENERAL:  Feels "ok".  No fevers or sweats.  Weight  gain of 21 pounds since 10/2016.  Weight stable since last visit. PERFORMANCE STATUS (ECOG):  0 HEENT:  No visual changes, sore throat, mouth sores or tenderness. Lungs: No shortness of breath or cough.  No hemoptysis. Cardiac:  No chest pain, palpitations, orthopnea, or PND.  Issues with blood pressure. Breasts:  No prior mammogram (see HPI). GI:   Constipation.  No nausea, vomiting, diarrhea, melena or hematochezia.  Colonoscopy performed 03/25/2017. GU:  No urgency, frequency, dysuria, or hematuria. Musculoskeletal:  Burning left thigh.  No back pain.  Arthritis.  No muscle tenderness. Extremities:  No pain or swelling. Skin:  No rashes or skin changes. Neuro:  Migraines secondary to "lack of coffee or aggravation".  No numbness or weakness, balance or coordination issues. Endocrine:  No diabetes, thyroid issues, hot flashes or night sweats. Psych:  No mood changes, depression or anxiety. Pain:  No focal pain. Review of systems:  All other systems reviewed and found to be negative.  Physical Exam: Blood pressure (!) 159/100, pulse (!) 102, temperature 97.6 F (36.4 C), temperature source Tympanic, resp. rate 14, weight 200 lb (90.7 kg). GENERAL:  Well developed, well nourished, woman sitting comfortably in the exam room in no acute distress. MENTAL STATUS:  Alert and oriented to person, place and time. HEAD:  Short black/blue and white hair (wig).  Normocephalic, atraumatic, face symmetric, no Cushingoid features. EYES:  Glasses.  Brown eyes.  Arcus senilis.  No conjunctivitis or scleral icterus. NEUROLOGICAL: Unremarkable. PSYCH:  Appropriate.   No visits with results within 3 Day(s) from this visit.  Latest known visit with results is:  Office Visit on 03/20/2017  Component Date Value Ref Range Status  . WBC 03/20/2017 4.8  3.6 - 11.0 K/uL Final  . RBC 03/20/2017 4.70  3.80 - 5.20 MIL/uL Final  . Hemoglobin 03/20/2017 14.1  12.0 - 16.0 g/dL Final  . HCT 03/20/2017 41.5  35.0 -  47.0 % Final  . MCV 03/20/2017 88.3  80.0 - 100.0 fL Final  . MCH 03/20/2017 30.0  26.0 - 34.0 pg Final  . MCHC 03/20/2017 33.9  32.0 - 36.0 g/dL Final  . RDW 03/20/2017 14.4  11.5 - 14.5 % Final  . Platelets 03/20/2017 261  150 - 440 K/uL Final  . Neutrophils Relative % 03/20/2017 33  % Final  . Neutro Abs 03/20/2017 1.6  1.4 - 6.5 K/uL Final  . Lymphocytes Relative 03/20/2017 54  % Final  . Lymphs Abs 03/20/2017 2.6  1.0 - 3.6 K/uL Final  . Monocytes Relative 03/20/2017 10  % Final  . Monocytes Absolute 03/20/2017 0.5  0.2 - 0.9 K/uL Final  . Eosinophils Relative 03/20/2017 2  % Final  . Eosinophils Absolute 03/20/2017 0.1  0 - 0.7 K/uL Final  . Basophils  Relative 03/20/2017 1  % Final  . Basophils Absolute 03/20/2017 0.0  0 - 0.1 K/uL Final  . Vitamin B-12 03/20/2017 537  180 - 914 pg/mL Final   Comment: (NOTE) This assay is not validated for testing neonatal or myeloproliferative syndrome specimens for Vitamin B12 levels. Performed at [facility], [place] [address] [place], [place] [address]   . Folate 03/20/2017 11.2  >5.9 ng/mL Final  . Copper 03/20/2017 137  72 - 166 ug/dL Final   Comment: (NOTE)                                Detection Limit = 5 Performed At: [facility] [place], [place] [id number] Lindon Romp MD CX:[id number]   . Anit Nuclear Antibody(ANA) 03/20/2017 Negative  Negative Final   Comment: (NOTE) Performed At: [facility] [place], [place] [id number] Lindon Romp MD YI:[id number]   . PATH INTERP XXX-IMP 03/20/2017 Comment   Final   No diagnostic immunophenotypic abnormality detected, see comment  . ANNOTATION COMMENT IMP 03/20/2017 Comment   Corrected   Recommend clinical correlation and follow up as appropriate.  Marland Kitchen CLINICAL INFO 03/20/2017 Comment   Corrected   Comment: (NOTE) Accompanying CBC dated 03-20-17 shows: WBC count 4.8, Neu 1.6, Lym 2.6, Mon 0.5   . Misc Source 03/20/2017  Comment   Final   Peripheral blood  . ASSESSMENT OF LEUKOCYTES 03/20/2017 Comment   Final   Comment: (NOTE) No monoclonal B cell population is detected. kappa:lambda ratio 2.4 CD4:CD8 ratio 2.9 22% of T cells show loss of CD7 expression, a finding that can be seen in both reactive and neoplastic processes. If clinically indicated, T-cell receptor gene rearrangement studies can be performed to confirm or exclude a clonal T-cell process. A small population of double positive (CD4+/CD8+) T cells is detected, representing 7% of the T cells and 3% of the total cells. Double positive T cells have been described in association with chronic viral infections, autoimmune disorders, chronic inflammatory disorders, and immunodeficiency states. CD57 positive cells are increased but are composed of a mixture of CD4 and CD8 positive T cells and NK cells. CD57 is a marker of large granular lymphocytes. Clinical correlation is recommended. No circulating blasts are detected. There is no immunophenotypic  evidence of abnormal myeloid maturation. Analysis of the leukocyte populat                          ion shows: granulocytes 41%, monocytes 7%, lymphocytes 52%, blasts <0.1%, B cells 10%, T cells 40%, LGLs 17%, NK cells 2%.   . % Viable Cells 03/20/2017 Comment   Corrected   93%  . ANALYSIS AND GATING STRATEGY 03/20/2017 Comment   Final   8 color analysis with CD45/SSC  . IMMUNOPHENOTYPING STUDY 03/20/2017 Comment   Final   Comment: (NOTE) CD2       Normal         CD3       Normal CD4       Normal         CD5       Normal CD7       See Text       CD8       Normal CD10      Normal         CD11b     Normal CD13  Normal         CD14      Normal CD16      Normal         CD19      Normal CD20      Normal         CD33      Normal CD34      Normal         CD38      Normal CD45      Normal         CD56      Normal CD57      Normal         CD117     Normal HLA-DR    Normal         KAPPA      Normal LAMBDA    Normal         CD64      Normal   . PATHOLOGIST NAME 03/20/2017 Comment   Final   Lovett Sox, M.D.  . COMMENT: 03/20/2017 Comment   Corrected   Comment: (NOTE) Each antibody in this assay was utilized to assess for potential abnormalities of studied cell populations or to characterize identified abnormalities. This test was developed and its performance characteristics determined by [facility].  It has not been cleared or approved by the U.S. Food and Drug Administration. The FDA has determined that such clearance or approval is not necessary. This test is used for clinical purposes.  It should not be regarded as investigational or for research. Performed At: -[facility] [address] [place], [place] [id number] Nechama Guard MD EX:[id number] Performed At: [facility] [address] [place], [place] [id number] Nechama Guard MD YQ:[id number]   . Hep B Core Total Ab 03/20/2017 Positive* Negative Final   Comment: (NOTE) Performed At: [facility] [place], [place] [id number] Lindon Romp MD RJ:[id number]   . HCV Ab 03/20/2017 <0.1  0.0 - 0.9 s/co ratio Final   Comment: (NOTE)                                  Negative:     < 0.8                             Indeterminate: 0.8 - 0.9                                  Positive:     > 0.9 The CDC recommends that a positive HCV antibody result be followed up with a HCV Nucleic Acid Amplification test (016010). Performed At: [facility] [place], [place] [id number] Lindon Romp MD KG:[id number]   . Hepatitis B Surface Ag 03/20/2017 Negative  Negative Final   Comment: (NOTE) Performed At: [facility] [place] , [place] [id number] Lindon Romp MD HY:[id number]   . HIV Screen 4th Generation wRfx 03/20/2017 Non Reactive  Non Reactive Final   Comment: (NOTE) Performed At: [facility] [place], [place] [id number] Lindon Romp MD JK:[id number]     Assessment:  GELENA KLOSINSKI is a 66 y.o. female with mild neutropenia x 4 months.  She has moderate lymphocytosis.  She has no history of  recurrent infections or gingivitis.  She denies any autoimmune disease.  Diet appears good.  She is on no herbal products or new medications.  She denies any history of hepatitis or HIV disease.  Work-up on 03/20/2017 revealed ahematocrit of 41.5, hemoglobin 14.1, platelets 261,000, white count 4800 with an ANC of 1600. Differential included 33% segs, 54% lymphs, 10% monocytes, 2% eosinophils and 1% basophils.  Normal studies included:  B12, folate, copper, and ANA.    Flow cytometry revealed no diagnostic immunophenotypic abnormality. T cell gene rearrangement studies could be performed in the future if needed to assess for a clonal T-cell process. CD57+ cells were increased but consisted of a mixture of CD4 and CD8+ T cells and NK cells and thus clinically not felt to be LGL leukemia.  There were no blasts.  Colonoscopy on 03/25/2017 revealed a normal colon.  She has never had a mammogram.  She denies any B symptoms.  Exam reveals no adenopathy or hepatosplenomegaly.  Plan: 1.  Discuss work-up.  CBC revealed a normal WBC and ANC.  Hepatitis B and C testing, ANA, copper, b12 and folate were normal.  Flow cytometry revealed no immunophenotypic abnormality.   2.  Discuss colonoscopy- normal. 3.  Discuss need for mammogram. 4.  RTC prn.   Honor Loh, NP  04/06/2017, 11:26 AM   I saw and evaluated the patient, participating in the key portions of the service and reviewing pertinent diagnostic studies and records.  I reviewed the nurse practitioner's note and agree with the findings and the plan.  The assessment and plan were discussed with the patient.  A few questions were asked by the patient and answered.   Nolon Stalls, MD 04/06/2017, 11:26 AM

## 2017-04-06 NOTE — Progress Notes (Signed)
Patient here for follow up with lab. She states that she has some mid back pain that she attributes to "sleeping wrong". She also states that her BP has been running high and she has an appt. With Dr. Ubaldo Glassing this afternoon.

## 2017-04-12 ENCOUNTER — Encounter: Payer: Self-pay | Admitting: Hematology and Oncology

## 2017-04-14 ENCOUNTER — Ambulatory Visit

## 2017-05-12 ENCOUNTER — Other Ambulatory Visit: Payer: Self-pay | Admitting: Cardiology

## 2017-05-13 ENCOUNTER — Other Ambulatory Visit: Payer: Self-pay | Admitting: Cardiology

## 2017-05-13 ENCOUNTER — Ambulatory Visit
Admission: RE | Admit: 2017-05-13 | Discharge: 2017-05-13 | Disposition: A | Discharge status: Home / Self Care | Source: Ambulatory Visit | Attending: Cardiology | Admitting: Cardiology

## 2017-05-13 DIAGNOSIS — M79605 Pain in left leg: Secondary | ICD-10-CM

## 2017-06-28 ENCOUNTER — Encounter: Payer: Self-pay | Admitting: Gynecology

## 2017-06-28 ENCOUNTER — Ambulatory Visit
Admission: EM | Admit: 2017-06-28 | Discharge: 2017-06-28 | Disposition: A | Discharge status: Home / Self Care | Attending: Family Medicine | Admitting: Family Medicine

## 2017-06-28 ENCOUNTER — Other Ambulatory Visit: Payer: Self-pay

## 2017-06-28 DIAGNOSIS — R5383 Other fatigue: Secondary | ICD-10-CM

## 2017-06-28 DIAGNOSIS — R51 Headache: Secondary | ICD-10-CM

## 2017-06-28 DIAGNOSIS — I10 Essential (primary) hypertension: Secondary | ICD-10-CM | POA: Diagnosis not present

## 2017-06-28 NOTE — ED Provider Notes (Signed)
[facility]    CSN: [id number] Arrival date & time: 06/28/17  1058  History   Chief Complaint Chief Complaint  Patient presents with  . Hypertension   HPI  67 year old female presents with hypertension.  Patient states that she has recently headache.  She woke up this morning and her headache was improved but her blood pressure remain elevated.  She states that her blood pressure was 154/105 this morning.  She states that she has had some difficulty controlling her blood pressure.  She is currently on lisinopril and verapamil.  She is concerned about her blood pressure elevations and would like to discuss additional treatment options today.  Other than fatigue and recent headache, she is feeling well.  No other associated symptoms.  No other complaints at this time.  Past Medical History:  Diagnosis Date  . Anxiety   . Constipation   . Coronary artery disease   . Depression   . Hyperlipidemia   . Hypertension   . Neuromuscular disorder ([place])    peripheral neuropathy  . Pre-diabetes     Patient Active Problem List   Diagnosis Date Noted  . Leukopenia 04/06/2017  . Essential hypertension 04/05/2015  . Hyperlipidemia 04/05/2015  . Raynaud's syndrome 04/05/2015  . Heart palpitations 04/05/2015  . Sinus bradycardia 04/05/2015    Past Surgical History:  Procedure Laterality Date  . COLONOSCOPY    . COLONOSCOPY WITH PROPOFOL N/A 03/25/2017   Procedure: COLONOSCOPY WITH PROPOFOL;  Surgeon: Toledo, Benay Pike, MD;  Location: [facility] ENDOSCOPY;  Service: Gastroenterology;  Laterality: N/A;  . DILATION AND CURETTAGE OF UTERUS     x 5-6 [id number]  . POLYPECTOMY      OB History    No data available       Home Medications    Prior to Admission medications   Medication Sig Start Date End Date Taking? Authorizing Provider  aspirin EC 81 MG tablet Take 81 mg by mouth daily.   Yes [provider]  diazepam (VALIUM) 5 MG tablet Take 2.5 mg by mouth daily.    Yes [provider]  gabapentin (NEURONTIN) 300 MG capsule Take 300 mg by mouth 3 (three) times daily. [phone] mg as needed   Yes [provider]  lisinopril (PRINIVIL,ZESTRIL) 10 MG tablet Take 10 mg by mouth daily.   Yes [provider]  simvastatin (ZOCOR) 10 MG tablet Take 10 mg by mouth daily. 12/22/16  Yes [provider]  verapamil (CALAN-SR) 180 MG CR tablet Take 2 tablets (360 mg total) by mouth at bedtime. 09/06/16  Yes Luvenia Redden, PA-C    Family History Family History  Problem Relation Age of Onset  . Colon cancer Mother   . Cancer Mother   . Heart disease Father   . Esophageal cancer Neg Hx   . Rectal cancer Neg Hx   . Stomach cancer Neg Hx     Social History Social History   Tobacco Use  . Smoking status: Former Smoker    Packs/day: 1.50    Years: 15.00    Pack years: 22.50    Last attempt to quit: 12/07/2013    Years since quitting: 3.5  . Smokeless tobacco: Never Used  Substance Use Topics  . Alcohol use: Yes    Comment: rare-2x a year  . Drug use: No     Allergies   Penicillins and Erythromycin   Review of Systems Review of Systems  Constitutional: Positive for fatigue.  Cardiovascular:  Elevated BP.  Neurological: Positive for headaches.   Physical Exam Triage Vital Signs ED Triage Vitals  Enc Vitals Group     BP 06/28/17 1120 (!) 168/106     Pulse Rate 06/28/17 1120 99     Resp 06/28/17 1120 16     Temp 06/28/17 1123 98.7 F (37.1 C)     Temp Source 06/28/17 1120 Oral     SpO2 06/28/17 1120 100 %     Weight 06/28/17 1124 200 lb (90.7 kg)     Height 06/28/17 1124 5\' 5"  (1.651 m)     Head Circumference --      Peak Flow --      Pain Score 06/28/17 1124 3     Pain Loc --      Pain Edu? --      Excl. in [place]? --     Updated Vital Signs BP (!) 148/92 (BP Location: Right Arm)   Pulse 99   Temp 98.7 F (37.1 C) (Oral)   Resp 16   Ht 5\' 5"  (1.651 m)   Wt 200 lb (90.7 kg)   SpO2 100%    BMI 33.28 kg/m     Physical Exam  Constitutional: She is oriented to person, place, and time. She appears well-developed and well-nourished. No distress.  HENT:  Head: Normocephalic and atraumatic.  Cardiovascular: Normal rate and regular rhythm.  No murmur heard. Pulmonary/Chest: Effort normal and breath sounds normal. She has no wheezes. She has no rales.  Neurological: She is alert and oriented to person, place, and time.  Skin: Skin is warm. No rash noted.  Psychiatric: She has a normal mood and affect. Her behavior is normal.  Nursing note and vitals reviewed.  UC Treatments / Results  Labs (all labs ordered are listed, but only abnormal results are displayed) Labs Reviewed - No data to display  EKG  EKG Interpretation None       Radiology No results found.  Procedures Procedures (including critical care time)  Medications Ordered in UC Medications - No data to display   Initial Impression / Assessment and Plan / UC Course  I have reviewed the triage vital signs and the nursing notes.  Pertinent labs & imaging results that were available during my care of the patient were reviewed by me and considered in my medical decision making (see chart for details).     67 year old female presents with hypertension.  Blood pressure here was 148/92.  I discussed her medications and potential addition of HCTZ.  Patient declined and preferred to call her primary tomorrow.  I am in agreement.  Final Clinical Impressions(s) / UC Diagnoses   Final diagnoses:  Essential hypertension    ED Discharge Orders    None     Controlled Substance Prescriptions [place] Controlled Substance Registry consulted? Not Applicable   Coral Spikes, DO 06/28/17 1333

## 2017-06-28 NOTE — ED Triage Notes (Signed)
Per patient husband was feeling sick this morning and her blood pressure went up this am of 154/105 and HR:111.

## 2017-06-28 NOTE — Discharge Instructions (Signed)
Call your doctor Monday morning.  Monitor BP closely.  Take care  Dr. Lacinda Axon

## 2017-07-01 ENCOUNTER — Telehealth: Payer: Self-pay

## 2017-07-01 NOTE — Telephone Encounter (Signed)
Called pt for f/u and left a VMM with our return contact information for questions/concerns.

## 2018-03-16 IMAGING — CR DG FOOT COMPLETE 3+V*L*
3 series · 3 of 3 positions shown · non-contrast
Comparison: None.

CLINICAL DATA: Tripped over concrete slab yesterday, lateral pain

EXAM:
LEFT FOOT - COMPLETE 3+ VIEW

[foot ap]
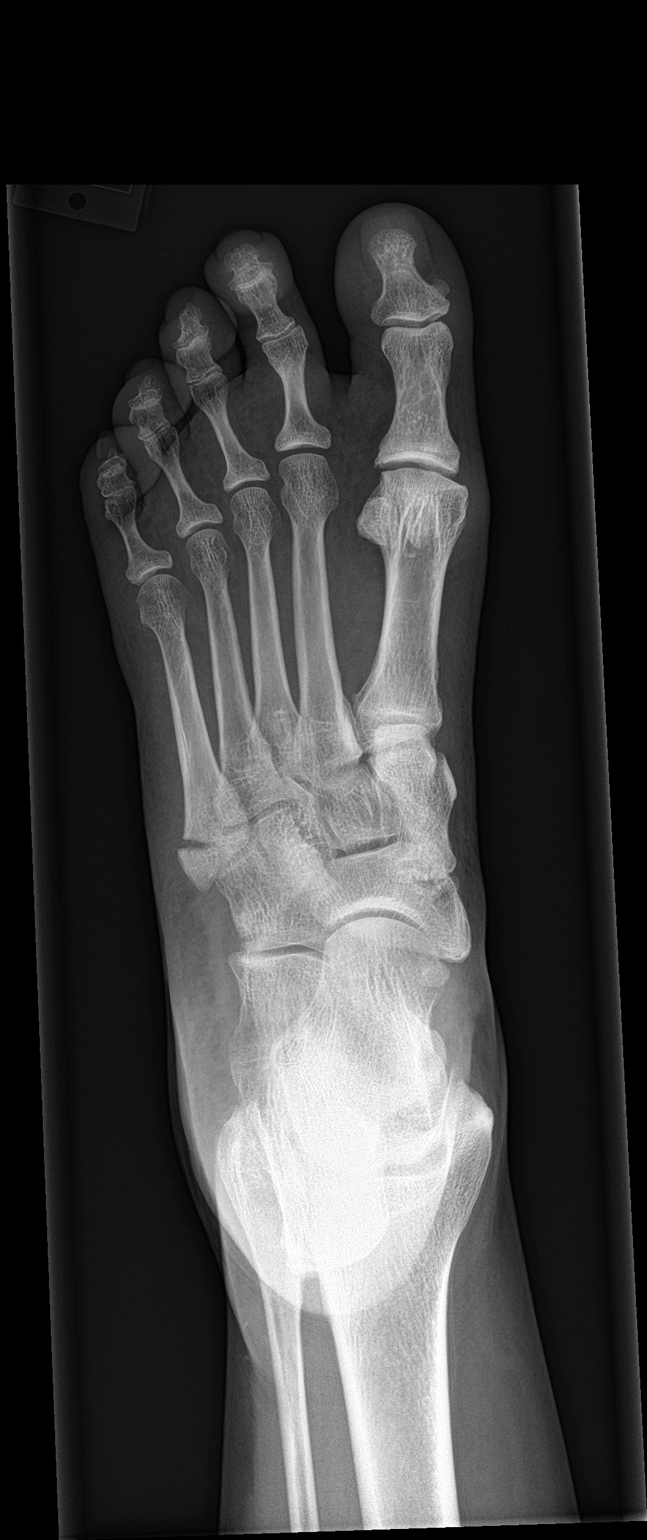

[foot obl]
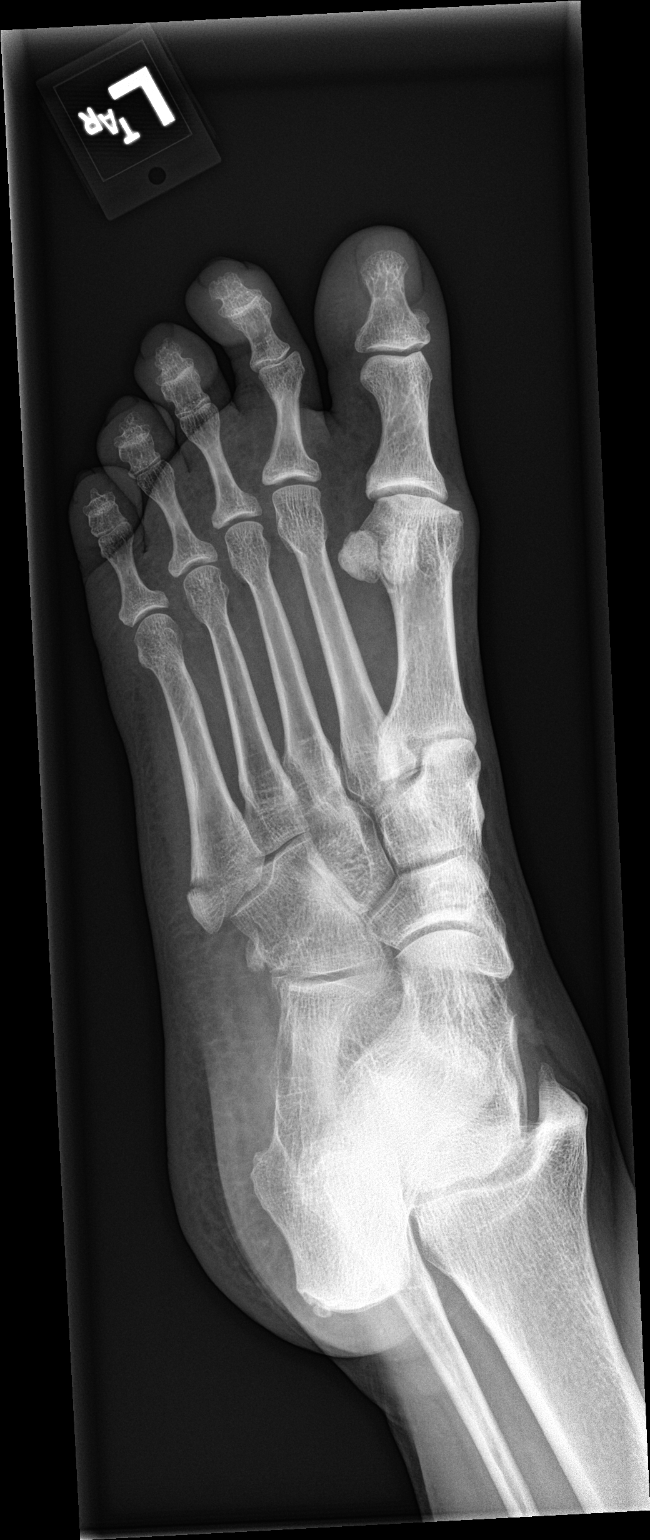

[foot lat]
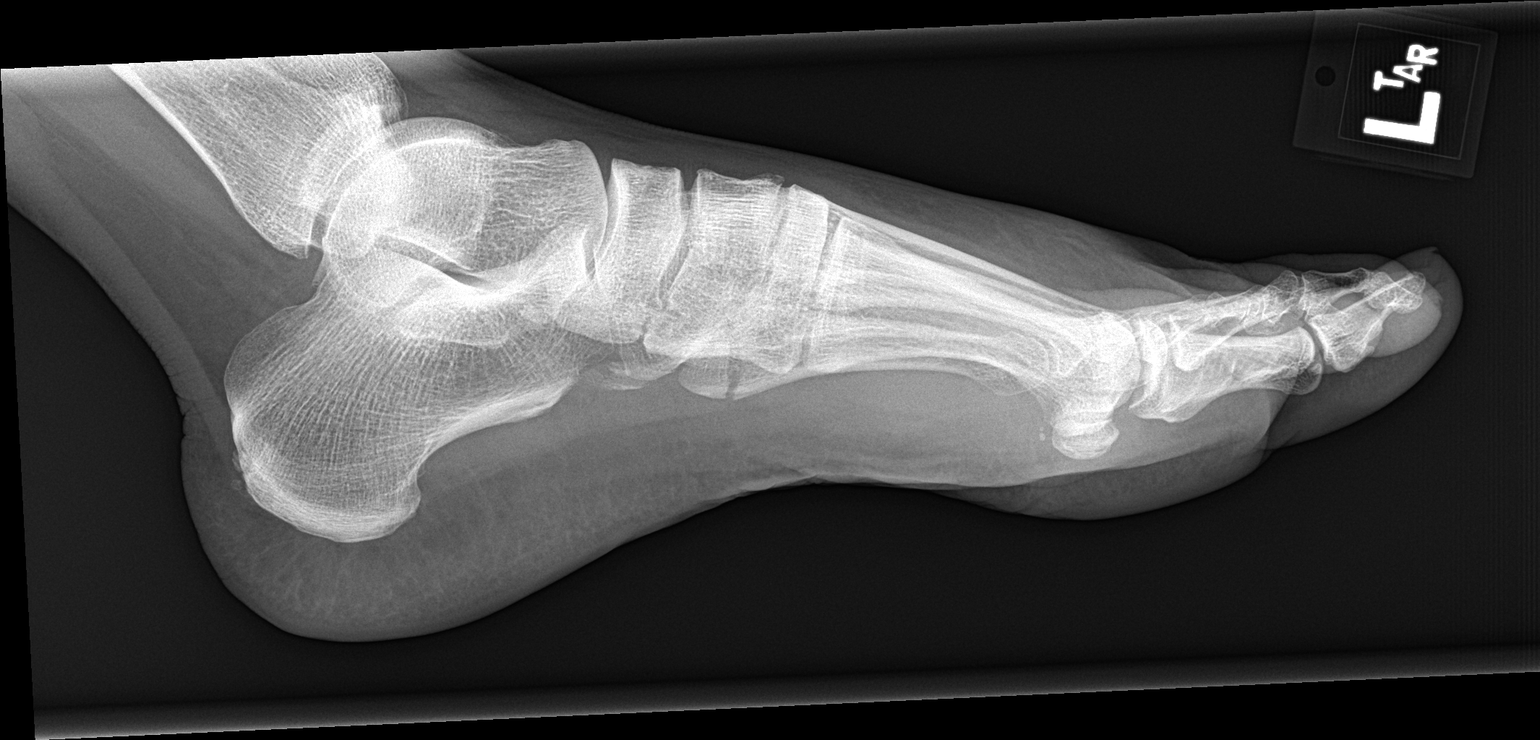

[3 of 3 positions shown; findings below may reference images not displayed]

FINDINGS: Three views of the left foot submitted. There is nondisplaced
transverse fracture at the base of fifth metatarsal. Mild soft
tissue swelling dorsal metatarsal region.
IMPRESSION: Nondisplaced transverse fracture at the base of fifth metatarsal.

## 2018-07-28 DIAGNOSIS — E785 Hyperlipidemia, unspecified: Secondary | ICD-10-CM | POA: Diagnosis not present

## 2018-07-28 DIAGNOSIS — G609 Hereditary and idiopathic neuropathy, unspecified: Secondary | ICD-10-CM | POA: Diagnosis not present

## 2018-07-28 DIAGNOSIS — I10 Essential (primary) hypertension: Secondary | ICD-10-CM | POA: Diagnosis not present

## 2018-07-28 DIAGNOSIS — R002 Palpitations: Secondary | ICD-10-CM | POA: Diagnosis not present

## 2018-07-28 DIAGNOSIS — R001 Bradycardia, unspecified: Secondary | ICD-10-CM | POA: Diagnosis not present

## 2018-07-28 DIAGNOSIS — R079 Chest pain, unspecified: Secondary | ICD-10-CM | POA: Diagnosis not present

## 2018-07-28 DIAGNOSIS — I7300 Raynaud's syndrome without gangrene: Secondary | ICD-10-CM | POA: Diagnosis not present

## 2018-10-15 ENCOUNTER — Ambulatory Visit
Admission: EM | Admit: 2018-10-15 | Discharge: 2018-10-15 | Disposition: A | Discharge status: Home / Self Care | Payer: PPO | Attending: Family Medicine | Admitting: Family Medicine

## 2018-10-15 ENCOUNTER — Other Ambulatory Visit: Payer: Self-pay

## 2018-10-15 DIAGNOSIS — B029 Zoster without complications: Secondary | ICD-10-CM | POA: Diagnosis not present

## 2018-10-15 DIAGNOSIS — I10 Essential (primary) hypertension: Secondary | ICD-10-CM | POA: Diagnosis not present

## 2018-10-15 MED ORDER — VALACYCLOVIR HCL 1 G PO TABS: 1000.0000 mg | ORAL_TABLET | Freq: Three times a day (TID) | ORAL | 0 refills | Status: DC

## 2018-10-15 NOTE — ED Provider Notes (Signed)
[facility]    CSN: [id number] Arrival date & time: 10/15/18  1433     History   Chief Complaint Chief Complaint  Patient presents with  . Rash    HPI Tanya Pace is a 68 y.o. female.   68 yo female with a c/o rash on her head and forehead for the past 5-6 days. States rash has been like blisters and painful but symptoms have been slightly improving. Denies fevers, chills.    Rash    Past Medical History:  Diagnosis Date  . Anxiety   . Constipation   . Coronary artery disease   . Depression   . Hyperlipidemia   . Hypertension   . Neuromuscular disorder ([place])    peripheral neuropathy  . Pre-diabetes     Patient Active Problem List   Diagnosis Date Noted  . Leukopenia 04/06/2017  . Essential hypertension 04/05/2015  . Hyperlipidemia 04/05/2015  . Raynaud's syndrome 04/05/2015  . Heart palpitations 04/05/2015  . Sinus bradycardia 04/05/2015    Past Surgical History:  Procedure Laterality Date  . COLONOSCOPY    . COLONOSCOPY WITH PROPOFOL N/A 03/25/2017   Procedure: COLONOSCOPY WITH PROPOFOL;  Surgeon: Toledo, Benay Pike, MD;  Location: [facility] ENDOSCOPY;  Service: Gastroenterology;  Laterality: N/A;  . DILATION AND CURETTAGE OF UTERUS     x 5-6 [id number]  . POLYPECTOMY      OB History   No obstetric history on file.      Home Medications    Prior to Admission medications   Medication Sig Start Date End Date Taking? Authorizing Provider  aspirin EC 81 MG tablet Take 81 mg by mouth daily.   Yes [provider]  diazepam (VALIUM) 5 MG tablet Take 2.5 mg by mouth daily.   Yes [provider]  gabapentin (NEURONTIN) 300 MG capsule Take 300 mg by mouth 3 (three) times daily. [phone] mg as needed   Yes [provider]  lisinopril (PRINIVIL,ZESTRIL) 10 MG tablet Take 10 mg by mouth daily.   Yes [provider]  simvastatin (ZOCOR) 10 MG tablet Take 10 mg by mouth daily. 12/22/16  Yes [provider]  verapamil (CALAN-SR) 180 MG CR tablet Take 2 tablets (360 mg total) by mouth at bedtime. 09/06/16  Yes Luvenia Redden, PA-C  valACYclovir (VALTREX) 1000 MG tablet Take 1 tablet (1,000 mg total) by mouth 3 (three) times daily. 10/15/18   Norval Gable, MD    Family History Family History  Problem Relation Age of Onset  . Colon cancer Mother   . Cancer Mother   . Heart disease Father   . Esophageal cancer Neg Hx   . Rectal cancer Neg Hx   . Stomach cancer Neg Hx     Social History Social History   Tobacco Use  . Smoking status: Former Smoker    Packs/day: 1.50    Years: 15.00    Pack years: 22.50    Last attempt to quit: 12/07/2013    Years since quitting: 4.8  . Smokeless tobacco: Never Used  Substance Use Topics  . Alcohol use: Yes    Comment: rare-2x a year  . Drug use: No     Allergies   Penicillins and Erythromycin   Review of Systems Review of Systems  Skin: Positive for rash.     Physical Exam Triage Vital Signs ED Triage Vitals  Enc Vitals Group     BP 10/15/18 1447 (!) 161/100     Pulse Rate  10/15/18 1447 75     Resp 10/15/18 1447 15     Temp 10/15/18 1447 98.7 F (37.1 C)     Temp Source 10/15/18 1447 Oral     SpO2 10/15/18 1447 98 %     Weight 10/15/18 1445 195 lb (88.5 kg)     Height 10/15/18 1445 5\' 5"  (1.651 m)     Head Circumference --      Peak Flow --      Pain Score 10/15/18 1445 5     Pain Loc --      Pain Edu? --      Excl. in [place]? --    No data found.  Updated Vital Signs BP (!) 161/100 (BP Location: Right Arm)   Pulse 75   Temp 98.7 F (37.1 C) (Oral)   Resp 15   Ht 5\' 5"  (1.651 m)   Wt 88.5 kg   SpO2 98%   BMI 32.45 kg/m   Visual Acuity Right Eye Distance:   Left Eye Distance:   Bilateral Distance:    Right Eye Near:   Left Eye Near:    Bilateral Near:     Physical Exam Vitals signs and nursing note reviewed.  Constitutional:      General: She is not in acute distress.    Appearance: She is  not toxic-appearing or diaphoretic.  Skin:    Findings: Rash present. Rash is scaling and vesicular.     Comments: Rash on forehead  Neurological:     Mental Status: She is alert.      UC Treatments / Results  Labs (all labs ordered are listed, but only abnormal results are displayed) Labs Reviewed - No data to display  EKG None  Radiology No results found.  Procedures Procedures (including critical care time)  Medications Ordered in UC Medications - No data to display  Initial Impression / Assessment and Plan / UC Course  I have reviewed the triage vital signs and the nursing notes.  Pertinent labs & imaging results that were available during my care of the patient were reviewed by me and considered in my medical decision making (see chart for details).      Final Clinical Impressions(s) / UC Diagnoses   Final diagnoses:  Herpes zoster without complication    ED Prescriptions    Medication Sig Dispense Auth. Provider   valACYclovir (VALTREX) 1000 MG tablet Take 1 tablet (1,000 mg total) by mouth 3 (three) times daily. 21 tablet Norval Gable, MD     1. diagnosis reviewed with patient 2. rx as per orders above; reviewed possible side effects, interactions, risks and benefits  3. Recommend supportive treatment with otc analgesics prn 4. Follow-up prn if symptoms worsen or don't improve  Controlled Substance Prescriptions [place] Controlled Substance Registry consulted? Not Applicable   Norval Gable, MD 10/15/18 2008

## 2018-10-15 NOTE — ED Triage Notes (Signed)
Patient complains of rash from her head and face that has a shingles like appearance. Patient states that she was originally bit by a spider on her head and she thought this was a caused by the bite.

## 2018-12-10 ENCOUNTER — Ambulatory Visit
Admission: EM | Admit: 2018-12-10 | Discharge: 2018-12-10 | Disposition: A | Discharge status: Home / Self Care | Payer: PPO | Attending: Family Medicine | Admitting: Family Medicine

## 2018-12-10 ENCOUNTER — Other Ambulatory Visit: Payer: Self-pay

## 2018-12-10 ENCOUNTER — Encounter: Payer: Self-pay | Admitting: Emergency Medicine

## 2018-12-10 DIAGNOSIS — L989 Disorder of the skin and subcutaneous tissue, unspecified: Secondary | ICD-10-CM | POA: Diagnosis not present

## 2018-12-10 MED ORDER — MUPIROCIN 2 % EX OINT: 1.0000 "application " | TOPICAL_OINTMENT | Freq: Two times a day (BID) | CUTANEOUS | 0 refills | Status: AC

## 2018-12-10 NOTE — ED Triage Notes (Signed)
Patient states that she was treated for Shingles in May.  Patient reports that she has an itchy bump on the right side of her back that started a week ago.  Patient denies fevers.

## 2018-12-10 NOTE — Discharge Instructions (Signed)
No evidence of shingles.  Medication as directed.  Take care  Dr. Lacinda Axon

## 2018-12-10 NOTE — ED Provider Notes (Signed)
[facility]    CSN: [id number] Arrival date & time: 12/10/18  1409  History   Chief Complaint Chief Complaint  Patient presents with  . Rash   HPI  68 year old female presents for evaluation of the above.  Patient reports that she was treated for shingles in May.  She has a "bump" on her back and she is concerned that she may be developing shingles.  This started approximately 1 week ago.  She states that it is itchy.  She only has 1 area.  No reports of blisters.  No significant pain.  She has been applying topical agents without resolution.  No other associated symptoms.  No other complaints.  PMH, Surgical Hx, Family Hx, Social History reviewed and updated as below.  Past Medical History:  Diagnosis Date  . Anxiety   . Constipation   . Coronary artery disease   . Depression   . Hyperlipidemia   . Hypertension   . Neuromuscular disorder ([place])    peripheral neuropathy  . Pre-diabetes     Patient Active Problem List   Diagnosis Date Noted  . Leukopenia 04/06/2017  . Essential hypertension 04/05/2015  . Hyperlipidemia 04/05/2015  . Raynaud's syndrome 04/05/2015  . Heart palpitations 04/05/2015  . Sinus bradycardia 04/05/2015    Past Surgical History:  Procedure Laterality Date  . COLONOSCOPY    . COLONOSCOPY WITH PROPOFOL N/A 03/25/2017   Procedure: COLONOSCOPY WITH PROPOFOL;  Surgeon: Toledo, Benay Pike, MD;  Location: [facility] END[facility];  Service: Gastroenterology;  Laterality: N/A;  . DILATION AND CURETTAGE OF UTERUS     x 5-6 [id number]  . POLYPECTOMY      OB History   No obstetric history on file.      Home Medications    Prior to Admission medications   Medication Sig Start Date End Date Taking? Authorizing Provider  aspirin EC 81 MG tablet Take 81 mg by mouth daily.   Yes [provider]  diazepam (VALIUM) 5 MG tablet Take 2.5 mg by mouth daily.   Yes [provider]  gabapentin (NEURONTIN) 300 MG capsule Take 300 mg by  mouth 3 (three) times daily. [phone] mg as needed   Yes [provider]  lisinopril (PRINIVIL,ZESTRIL) 10 MG tablet Take 10 mg by mouth daily.   Yes [provider]  simvastatin (ZOCOR) 10 MG tablet Take 10 mg by mouth daily. 12/22/16  Yes [provider]  verapamil (CALAN-SR) 180 MG CR tablet Take 2 tablets (360 mg total) by mouth at bedtime. 09/06/16  Yes Luvenia Redden, PA-C  mupirocin ointment (BACTROBAN) 2 % Apply 1 application topically 2 (two) times daily for 7 days. 12/10/18 12/17/18  Coral Spikes, DO  valACYclovir (VALTREX) 1000 MG tablet Take 1 tablet (1,000 mg total) by mouth 3 (three) times daily. 10/15/18   Norval Gable, MD    Family History Family History  Problem Relation Age of Onset  . Colon cancer Mother   . Cancer Mother   . Heart disease Father   . Esophageal cancer Neg Hx   . Rectal cancer Neg Hx   . Stomach cancer Neg Hx     Social History Social History   Tobacco Use  . Smoking status: Former Smoker    Packs/day: 1.50    Years: 15.00    Pack years: 22.50    Quit date: 12/07/2013    Years since quitting: 5.0  . Smokeless tobacco: Never Used  Substance Use Topics  . Alcohol use: Yes  Comment: rare-2x a year  . Drug use: No     Allergies   Penicillins and Erythromycin   Review of Systems Review of Systems  Constitutional: Negative.   Skin: Positive for rash.   Physical Exam Triage Vital Signs ED Triage Vitals  Enc Vitals Group     BP 12/10/18 1435 (!) 152/83     Pulse Rate 12/10/18 1435 62     Resp 12/10/18 1435 16     Temp 12/10/18 1435 98.4 F (36.9 C)     Temp Source 12/10/18 1435 Oral     SpO2 12/10/18 1435 99 %     Weight 12/10/18 1432 194 lb (88 kg)     Height 12/10/18 1432 5\' 5"  (1.651 m)     Head Circumference --      Peak Flow --      Pain Score 12/10/18 1432 0     Pain Loc --      Pain Edu? --      Excl. in [place]? --    Updated Vital Signs BP (!) 152/83 (BP Location: Right Arm)   Pulse 62    Temp 98.4 F (36.9 C) (Oral)   Resp 16   Ht 5\' 5"  (1.651 m)   Wt 88 kg   SpO2 99%   BMI 32.28 kg/m   Visual Acuity Right Eye Distance:   Left Eye Distance:   Bilateral Distance:    Right Eye Near:   Left Eye Near:    Bilateral Near:     Physical Exam Vitals signs and nursing note reviewed.  Constitutional:      General: She is not in acute distress.    Appearance: Normal appearance.  HENT:     Head: Normocephalic and atraumatic.  Eyes:     General:        Right eye: No discharge.        Left eye: No discharge.     Conjunctiva/sclera: Conjunctivae normal.  Pulmonary:     Effort: Pulmonary effort is normal. No respiratory distress.  Skin:         Comments: Small papule noted at the leg location.  No evidence of vesicular rash.   Neurological:     Mental Status: She is alert.  Psychiatric:        Mood and Affect: Mood normal.        Behavior: Behavior normal.    UC Treatments / Results  Labs (all labs ordered are listed, but only abnormal results are displayed) Labs Reviewed - No data to display  EKG   Radiology No results found.  Procedures Procedures (including critical care time)  Medications Ordered in UC Medications - No data to display  Initial Impression / Assessment and Plan / UC Course  I have reviewed the triage vital signs and the nursing notes.  Pertinent labs & imaging results that were available during my care of the patient were reviewed by me and considered in my medical decision making (see chart for details).    68 year old female presents with a skin lesion/papule.  No evidence of shingles.  Bactroban ointment as prescribed.  Final Clinical Impressions(s) / UC Diagnoses   Final diagnoses:  Skin lesion     Discharge Instructions     No evidence of shingles.  Medication as directed.  Take care  Dr. Lacinda Axon    ED Prescriptions    Medication Sig Dispense Auth. Provider   mupirocin ointment (BACTROBAN) 2 % Apply 1  application topically 2 (  two) times daily for 7 days. 22 g Coral Spikes, DO     Controlled Substance Prescriptions [place] Controlled Substance Registry consulted? Not Applicable   Coral Spikes, DO 12/10/18 1524

## 2019-02-01 DIAGNOSIS — I10 Essential (primary) hypertension: Secondary | ICD-10-CM | POA: Diagnosis not present

## 2019-02-01 DIAGNOSIS — E785 Hyperlipidemia, unspecified: Secondary | ICD-10-CM | POA: Diagnosis not present

## 2019-02-01 DIAGNOSIS — I7300 Raynaud's syndrome without gangrene: Secondary | ICD-10-CM | POA: Diagnosis not present

## 2019-03-30 DIAGNOSIS — E785 Hyperlipidemia, unspecified: Secondary | ICD-10-CM | POA: Diagnosis not present

## 2019-03-30 DIAGNOSIS — R001 Bradycardia, unspecified: Secondary | ICD-10-CM | POA: Diagnosis not present

## 2019-03-30 DIAGNOSIS — R002 Palpitations: Secondary | ICD-10-CM | POA: Diagnosis not present

## 2019-03-30 DIAGNOSIS — I10 Essential (primary) hypertension: Secondary | ICD-10-CM | POA: Diagnosis not present

## 2019-03-30 DIAGNOSIS — I7300 Raynaud's syndrome without gangrene: Secondary | ICD-10-CM | POA: Diagnosis not present

## 2019-06-15 DIAGNOSIS — E785 Hyperlipidemia, unspecified: Secondary | ICD-10-CM | POA: Diagnosis not present

## 2019-06-15 DIAGNOSIS — Z1321 Encounter for screening for nutritional disorder: Secondary | ICD-10-CM | POA: Diagnosis not present

## 2019-06-15 DIAGNOSIS — Z1329 Encounter for screening for other suspected endocrine disorder: Secondary | ICD-10-CM | POA: Diagnosis not present

## 2019-06-15 DIAGNOSIS — R002 Palpitations: Secondary | ICD-10-CM | POA: Diagnosis not present

## 2019-06-15 DIAGNOSIS — E559 Vitamin D deficiency, unspecified: Secondary | ICD-10-CM | POA: Diagnosis not present

## 2019-06-15 DIAGNOSIS — Z7689 Persons encountering health services in other specified circumstances: Secondary | ICD-10-CM | POA: Diagnosis not present

## 2019-06-15 DIAGNOSIS — M79671 Pain in right foot: Secondary | ICD-10-CM | POA: Diagnosis not present

## 2019-06-15 DIAGNOSIS — Z79899 Other long term (current) drug therapy: Secondary | ICD-10-CM | POA: Diagnosis not present

## 2019-06-15 DIAGNOSIS — R7301 Impaired fasting glucose: Secondary | ICD-10-CM | POA: Diagnosis not present

## 2019-06-15 DIAGNOSIS — Z7189 Other specified counseling: Secondary | ICD-10-CM | POA: Diagnosis not present

## 2019-06-15 DIAGNOSIS — G609 Hereditary and idiopathic neuropathy, unspecified: Secondary | ICD-10-CM | POA: Diagnosis not present

## 2019-06-15 DIAGNOSIS — Z23 Encounter for immunization: Secondary | ICD-10-CM | POA: Diagnosis not present

## 2019-06-15 DIAGNOSIS — I10 Essential (primary) hypertension: Secondary | ICD-10-CM | POA: Diagnosis not present

## 2019-06-15 DIAGNOSIS — I7300 Raynaud's syndrome without gangrene: Secondary | ICD-10-CM | POA: Diagnosis not present

## 2019-06-21 DIAGNOSIS — M8588 Other specified disorders of bone density and structure, other site: Secondary | ICD-10-CM | POA: Diagnosis not present

## 2019-06-27 DIAGNOSIS — M722 Plantar fascial fibromatosis: Secondary | ICD-10-CM | POA: Diagnosis not present

## 2019-06-27 DIAGNOSIS — G609 Hereditary and idiopathic neuropathy, unspecified: Secondary | ICD-10-CM | POA: Diagnosis not present

## 2019-06-27 DIAGNOSIS — I7300 Raynaud's syndrome without gangrene: Secondary | ICD-10-CM | POA: Diagnosis not present

## 2019-07-18 DIAGNOSIS — M722 Plantar fascial fibromatosis: Secondary | ICD-10-CM | POA: Diagnosis not present

## 2019-07-22 DIAGNOSIS — I10 Essential (primary) hypertension: Secondary | ICD-10-CM | POA: Diagnosis not present

## 2019-07-22 DIAGNOSIS — R002 Palpitations: Secondary | ICD-10-CM | POA: Diagnosis not present

## 2019-07-22 DIAGNOSIS — R079 Chest pain, unspecified: Secondary | ICD-10-CM | POA: Diagnosis not present

## 2019-07-22 DIAGNOSIS — E785 Hyperlipidemia, unspecified: Secondary | ICD-10-CM | POA: Diagnosis not present

## 2019-08-10 DIAGNOSIS — R079 Chest pain, unspecified: Secondary | ICD-10-CM | POA: Diagnosis not present

## 2019-12-21 DIAGNOSIS — Z1329 Encounter for screening for other suspected endocrine disorder: Secondary | ICD-10-CM | POA: Diagnosis not present

## 2019-12-21 DIAGNOSIS — I10 Essential (primary) hypertension: Secondary | ICD-10-CM | POA: Diagnosis not present

## 2019-12-21 DIAGNOSIS — E785 Hyperlipidemia, unspecified: Secondary | ICD-10-CM | POA: Diagnosis not present

## 2019-12-21 DIAGNOSIS — E559 Vitamin D deficiency, unspecified: Secondary | ICD-10-CM | POA: Diagnosis not present

## 2019-12-21 DIAGNOSIS — Z0000 Encounter for general adult medical examination without abnormal findings: Secondary | ICD-10-CM | POA: Diagnosis not present

## 2019-12-21 DIAGNOSIS — R7301 Impaired fasting glucose: Secondary | ICD-10-CM | POA: Diagnosis not present

## 2019-12-21 DIAGNOSIS — M8588 Other specified disorders of bone density and structure, other site: Secondary | ICD-10-CM | POA: Diagnosis not present

## 2020-02-14 DIAGNOSIS — R350 Frequency of micturition: Secondary | ICD-10-CM | POA: Diagnosis not present

## 2020-02-14 DIAGNOSIS — R110 Nausea: Secondary | ICD-10-CM | POA: Diagnosis not present

## 2020-02-14 DIAGNOSIS — R319 Hematuria, unspecified: Secondary | ICD-10-CM | POA: Diagnosis not present

## 2020-02-14 DIAGNOSIS — R1013 Epigastric pain: Secondary | ICD-10-CM | POA: Diagnosis not present

## 2020-02-14 DIAGNOSIS — N390 Urinary tract infection, site not specified: Secondary | ICD-10-CM | POA: Diagnosis not present

## 2020-03-12 DIAGNOSIS — K30 Functional dyspepsia: Secondary | ICD-10-CM | POA: Diagnosis not present

## 2020-03-12 DIAGNOSIS — R195 Other fecal abnormalities: Secondary | ICD-10-CM | POA: Diagnosis not present

## 2020-04-25 DIAGNOSIS — I10 Essential (primary) hypertension: Secondary | ICD-10-CM | POA: Diagnosis not present

## 2020-04-25 DIAGNOSIS — E785 Hyperlipidemia, unspecified: Secondary | ICD-10-CM | POA: Diagnosis not present

## 2020-04-25 DIAGNOSIS — R002 Palpitations: Secondary | ICD-10-CM | POA: Diagnosis not present

## 2020-04-25 DIAGNOSIS — R350 Frequency of micturition: Secondary | ICD-10-CM | POA: Diagnosis not present

## 2020-04-25 DIAGNOSIS — R0789 Other chest pain: Secondary | ICD-10-CM | POA: Diagnosis not present

## 2020-08-01 ENCOUNTER — Other Ambulatory Visit: Payer: Self-pay | Admitting: Gastroenterology

## 2020-08-01 ENCOUNTER — Other Ambulatory Visit (HOSPITAL_COMMUNITY): Payer: Self-pay | Admitting: Gastroenterology

## 2020-08-01 DIAGNOSIS — R634 Abnormal weight loss: Secondary | ICD-10-CM

## 2020-08-01 DIAGNOSIS — R17 Unspecified jaundice: Secondary | ICD-10-CM

## 2020-08-01 DIAGNOSIS — R194 Change in bowel habit: Secondary | ICD-10-CM

## 2020-08-01 DIAGNOSIS — R109 Unspecified abdominal pain: Secondary | ICD-10-CM

## 2020-08-10 ENCOUNTER — Other Ambulatory Visit: Payer: Self-pay

## 2020-08-10 ENCOUNTER — Other Ambulatory Visit
Admission: RE | Admit: 2020-08-10 | Discharge: 2020-08-10 | Disposition: A | Discharge status: Home / Self Care | Payer: Medicare Other | Source: Ambulatory Visit | Attending: Gastroenterology | Admitting: Gastroenterology

## 2020-08-10 DIAGNOSIS — Z01812 Encounter for preprocedural laboratory examination: Secondary | ICD-10-CM | POA: Diagnosis present

## 2020-08-10 DIAGNOSIS — Z20822 Contact with and (suspected) exposure to covid-19: Secondary | ICD-10-CM | POA: Diagnosis not present

## 2020-08-10 LAB — SARS CORONAVIRUS 2 (TAT 6-24 HRS): SARS Coronavirus 2: NEGATIVE

## 2020-08-14 ENCOUNTER — Other Ambulatory Visit: Payer: Self-pay

## 2020-08-14 ENCOUNTER — Ambulatory Visit
Admission: RE | Admit: 2020-08-14 | Discharge: 2020-08-14 | Disposition: A | Discharge status: Home / Self Care | Payer: Medicare Other | Source: Ambulatory Visit | Attending: Gastroenterology | Admitting: Gastroenterology

## 2020-08-14 DIAGNOSIS — R17 Unspecified jaundice: Secondary | ICD-10-CM | POA: Insufficient documentation

## 2020-08-14 DIAGNOSIS — R109 Unspecified abdominal pain: Secondary | ICD-10-CM | POA: Diagnosis present

## 2020-08-14 DIAGNOSIS — R634 Abnormal weight loss: Secondary | ICD-10-CM | POA: Diagnosis present

## 2020-08-14 DIAGNOSIS — R194 Change in bowel habit: Secondary | ICD-10-CM | POA: Insufficient documentation

## 2020-08-14 MED ORDER — IOHEXOL 300 MG/ML  SOLN
100.0000 mL | Freq: Once | INTRAMUSCULAR | Status: AC | PRN
  Administered 2020-08-14: 100 mL via INTRAVENOUS

## 2020-08-27 DIAGNOSIS — R634 Abnormal weight loss: Secondary | ICD-10-CM | POA: Insufficient documentation

## 2020-08-27 NOTE — Progress Notes (Signed)
[facility]  [address] [place], [place] [address] Phone: [phone]  Fax: [phone]   Clinic Day:  08/28/2020  Referring physician: Toni Arthurs, NP  Chief Complaint: MAELY CLEMENTS is a 70 y.o. female with weight loss and neutropenia who is referred in consultation by Toni Arthurs, NP for assessment and management.   HPI: The patient was seen by me in 02/2017 for neutropenia. Work-up revealed a hematocrit of 41.5, hemoglobin 14.1, platelets 261,000, WBC 4,800. Vitamin B12 was 537, folate 11.2. Copper was 137. ANA was negative. Hepatitis B core antibody was positive. Hepatitis B surface antigen, Hepatitis C antibody, and HIV antibody were negative. Flow cytometry revealed no diagnostic immunophenotypic abnormality. She was to follow-up prn.  The patient saw Stephens November, NP on 08/01/2020 for new patient assessment. She reported weight loss since 12/2019. She was eating well. Colonoscopy on 03/25/2017 revealed non bleeding internal hemorrhoids.  EGD and colonoscopy are scheduled for 10/08/2020.  Abdomen and pelvis CT with contrast on 08/14/2020 revealed no acute abdominopelvic findings. There were scattered left-sided colonic diverticula without findings of acute diverticulitis. There was lobular uterine contour with dystrophic calcifications which was poorly evaluated on CT but likely represented uterine leiomyomas, further evaluation with pelvic ultrasound could be considered if clinically indicated. There was prominent pelvic vasculature and gonadal veins, which can be seen with pelvic congestion syndrome. There was aortic atherosclerosis.  The patient saw Toni Arthurs, NP on 08/21/2020. She reported weight loss and fatigue. Hematocrit was 39.9, hemoglobin 13.5, platelets 238,000, WBC 3,900 (ANC 1,200).  Vitamin B12 was 439. TSH was 1.220 and free T4 was 0.81. She was referred to hematology.  Labs followed: 03/20/2017: Hematocrit 41.5,  hemoglobin 14.1, platelets 261,000, WBC 4,800 (ANC 1,600). 06/15/2019: Hematocrit 39.7, hemoglobin 13.0, platelets 242,000, WBC 5,300 (ANC 2,300). 12/21/2019: Hematocrit 40.2, hemoglobin 13.4, platelets 222,000, WBC 4,900 (ANC 1,700). 02/14/2020: Hematocrit 39.2, hemoglobin 13.3, platelets 246,000, WBC 4,300 ([place] 2,040). 03/12/2020: Hematocrit 41.5, hemoglobin 13.7, platelets 236,000, WBC 3,800 (ANC 1,200). 08/01/2020: Hematocrit 40.7, hemoglobin 13.4, platelets 257,000, WBC 4,000 (ANC 1,110).  Symptomatically, she feels "fine." She reports weight loss. She weighed 198 lbs in 10/2019. She dropped to 170 lbs in 12/2019. Last week, she was 176 lbs. Today, she is 173 lbs. She had a root canal and dental extraction last June and could not eat any solid foods for a while. She had a crown done on 07/23/2020. Her teeth are doing much better now and she is eating regularly. Her appetite is good and she denies nausea, vomiting, and diarrhea.  She sweats all night and sometimes during the day. The sweats have been going on for 20 years but they have worsened recently. She had a yeast infection and a UTI last year. Her vision is getting worse. She has occasional constipation. She urinates a lot but drinks a lot of water. She has plantar fasciitis, hip pain, and a constant back pain underneath her bra strap.She bruises easily.  The patient denies fevers, headaches, runny nose, sore throat, cough, shortness of breath, chest pain, palpitations, nausea, vomiting, diarrhea, reflux, bone or joint symptoms, numbness, weakness, balance or coordination problems, and bleeding of any kind.  She is not using any new medications or herbal products. She denies a history of transfusions.  She has never had a mammogram; patient declines. She declines HIV testing.  Her mother had colon cancer. Her cousin has pancreatic cancer.   Past Medical History:  Diagnosis Date  . Anxiety   . Constipation   .  Coronary artery disease    . Depression   . Hyperlipidemia   . Hypertension   . Neuromuscular disorder ([place])    peripheral neuropathy  . Pre-diabetes     Past Surgical History:  Procedure Laterality Date  . COLONOSCOPY    . COLONOSCOPY WITH PROPOFOL N/A 03/25/2017   Procedure: COLONOSCOPY WITH PROPOFOL;  Surgeon: Toledo, Benay Pike, MD;  Location: [facility] ENDOSCOPY;  Service: Gastroenterology;  Laterality: N/A;  . DILATION AND CURETTAGE OF UTERUS     x 5-6 [phone]  . POLYPECTOMY      Family History  Problem Relation Age of Onset  . Colon cancer Mother   . Cancer Mother   . Heart disease Father   . Pancreatic cancer Cousin   . Esophageal cancer Neg Hx   . Rectal cancer Neg Hx   . Stomach cancer Neg Hx     Social History:  reports that she quit smoking about 6 years ago. She has a 22.50 pack-year smoking history. She has never used smokeless tobacco. She reports current alcohol use. She reports that she does not use drugs.She quit smoking in 2015. She drinks alcohol once per year. She denies any exposure to radiation or toxins. She used to work for the [employer]. 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 The patient is alone today.  Allergies:  Allergies  Allergen Reactions  . Penicillins Other (See Comments)    Gave yeast infection  . Erythromycin Nausea And Vomiting    Current Medications: Current Outpatient Medications  Medication Sig Dispense Refill  . aspirin EC 81 MG tablet Take 81 mg by mouth daily.    . diazepam (VALIUM) 5 MG tablet Take 2.5 mg by mouth daily.    . potassium chloride SA (KLOR-CON) 20 MEQ tablet Take 1 tablet by mouth 2 (two) times daily.    . simvastatin (ZOCOR) 10 MG tablet Take 10 mg by mouth daily.  11  . valsartan-hydrochlorothiazide (DIOVAN-HCT) 80-12.5 MG tablet Take 1 tablet by mouth daily.    Marland Kitchen gabapentin (NEURONTIN) 300 MG capsule Take 300 mg by mouth 3 (three) times daily. [phone] mg as needed (Patient not taking:  Reported on 08/28/2020)    . lisinopril (PRINIVIL,ZESTRIL) 10 MG tablet Take 10 mg by mouth daily. (Patient not taking: Reported on 08/28/2020)    . valACYclovir (VALTREX) 1000 MG tablet Take 1 tablet (1,000 mg total) by mouth 3 (three) times daily. (Patient not taking: Reported on 08/28/2020) 21 tablet 0  . verapamil (CALAN-SR) 180 MG CR tablet Take 2 tablets (360 mg total) by mouth at bedtime. (Patient not taking: Reported on 08/28/2020) 60 tablet 0   No current facility-administered medications for this visit.    Review of Systems  Constitutional: Positive for diaphoresis and weight loss. Negative for chills, fever and malaise/fatigue.  HENT: Negative for congestion, ear discharge, ear pain, hearing loss, nosebleeds, sinus pain, sore throat and tinnitus.   Eyes:       Worsening vision  Respiratory: Negative for cough, hemoptysis, sputum production and shortness of breath.   Cardiovascular: Negative for chest pain, palpitations and leg swelling.  Gastrointestinal: Positive for constipation (occasional). Negative for abdominal pain, blood in stool, diarrhea, heartburn, melena, nausea and vomiting.  Genitourinary: Negative for dysuria, frequency, hematuria and urgency.  Musculoskeletal: Positive for back pain (constant, underneath bra strap) and joint pain (right hip). Negative for myalgias and neck pain.       Plantar fasciitis.  Skin: Negative for itching  and rash.  Neurological: Negative for dizziness, tingling, sensory change, weakness and headaches.  Endo/Heme/Allergies: Bruises/bleeds easily.  Psychiatric/Behavioral: Negative for depression and memory loss. The patient is not nervous/anxious and does not have insomnia.   All other systems reviewed and are negative.  Performance status (ECOG): 1  Vitals Blood pressure 126/88, pulse 76, temperature 98.9 F (37.2 C), resp. rate 18, weight 173 lb 4.5 oz (78.6 kg).   Physical Exam Vitals and nursing note reviewed.  Constitutional:       General: She is not in acute distress.    Appearance: She is not diaphoretic.  HENT:     Head: Normocephalic and atraumatic.     Mouth/Throat:     Mouth: Mucous membranes are moist.     Pharynx: Oropharynx is clear.  Eyes:     General: No scleral icterus.    Extraocular Movements: Extraocular movements intact.     Conjunctiva/sclera: Conjunctivae normal.     Pupils: Pupils are equal, round, and reactive to light.  Cardiovascular:     Rate and Rhythm: Normal rate and regular rhythm.     Heart sounds: Normal heart sounds. No murmur heard.   Pulmonary:     Effort: Pulmonary effort is normal. No respiratory distress.     Breath sounds: Normal breath sounds. No wheezing or rales.  Chest:     Chest wall: No tenderness.  Breasts:     Right: No axillary adenopathy or supraclavicular adenopathy.     Left: No axillary adenopathy or supraclavicular adenopathy.    Abdominal:     General: Bowel sounds are normal. There is no distension.     Palpations: Abdomen is soft. There is no mass.     Tenderness: There is no abdominal tenderness. There is no guarding or rebound.  Musculoskeletal:        General: Tenderness (mid lumbar spine) present. No swelling. Normal range of motion.     Cervical back: Normal range of motion and neck supple.  Lymphadenopathy:     Head:     Right side of head: No preauricular, posterior auricular or occipital adenopathy.     Left side of head: No preauricular, posterior auricular or occipital adenopathy.     Cervical: No cervical adenopathy.     Upper Body:     Right upper body: No supraclavicular or axillary adenopathy.     Left upper body: No supraclavicular or axillary adenopathy.     Lower Body: No right inguinal adenopathy. No left inguinal adenopathy.  Skin:    General: Skin is warm and dry.  Neurological:     Mental Status: She is alert and oriented to person, place, and time.  Psychiatric:        Behavior: Behavior normal.        Thought Content:  Thought content normal.        Judgment: Judgment normal.    No visits with results within 3 Day(s) from this visit.  Latest known visit with results is:  Hospital Outpatient Visit on 08/10/2020  Component Date Value Ref Range Status  . SARS Coronavirus 2 08/10/2020 NEGATIVE  NEGATIVE Final   Comment: (NOTE) SARS-CoV-2 target nucleic acids are NOT DETECTED.  The SARS-CoV-2 RNA is generally detectable in upper and lower respiratory specimens during the acute phase of infection. Negative results do not preclude SARS-CoV-2 infection, do not rule out co-infections with other pathogens, and should not be used as the sole basis for treatment or other patient management decisions. Negative results must  be combined with clinical observations, patient history, and epidemiological information. The expected result is Negative.  Fact Sheet for Patients: SugarRoll.be  Fact Sheet for Healthcare Providers: https://www.woods-mathews.com/  This test is not yet approved or cleared by the [place] FDA and  has been authorized for detection and/or diagnosis of SARS-CoV-2 by FDA under an Emergency Use Authorization (EUA). This EUA will remain  in effect (meaning this test can be used) for the duration of the COVID-19 declaration under Se                          ction 564(b)(1) of the Act, 21 U.S.C. section 360bbb-3(b)(1), unless the authorization is terminated or revoked sooner.  Performed at [facility], [place] [address], [place], [place] [address]     Assessment:  KYONA CHAUNCEY is a 70 y.o. female with a history of mild neutropenia.  She has had moderate lymphocytosis.  She has no history of recurrent infections or gingivitis.  She denies any autoimmune disease.  Diet appears good.  She is on no herbal products or new medications.  She denies any history of hepatitis or HIV disease.  She has weight loss.  Work-up on 03/20/2017  revealed ahematocrit of 41.5, hemoglobin 14.1, platelets 261,000, white count 4800 with an ANC of 1600. Differential included 33% segs, 54% lymphs, 10% monocytes, 2% eosinophils and 1% basophils.  Normal studies included:  B12, folate, copper, and ANA.  Flow cytometry revealed no diagnostic immunophenotypic abnormality. T cell gene rearrangement studies could be performed in the future if needed to assess for a clonal T-cell process. CD57+ cells were increased but consisted of a mixture of CD4 and CD8+ T cells and NK cells and thus clinically not felt to be LGL leukemia.  There were no blasts.  Labs on 08/21/2020 revealed a hematocrit was 39.9, hemoglobin 13.5, platelets 238,000, WBC 3,900 (ANC 1,200).  Vitamin B12 was 439. TSH was 1.220 and free T4 was 0.81.  Colonoscopy on 03/25/2017 revealed a normal colon.  She is scheduled for EGD and colonoscopy on 10/08/2020.  She has never had a mammogram.  She denies any B symptoms.  Exam reveals no adenopathy or hepatosplenomegaly.  Symptomatically, she feels "fine." She has had weight loss, but it appears to be stable/improving.  She has a 20 year history of sweats. She had a yeast infection and a UTI last year.  She has back pain.  Plan: 1.   Labs today: CBC with diff, CMP, myeloma panel, copper, folate, flow cytometry. 2.   Neutropenia  WBC 3900 with an [place] of 1200 on 08/21/2020.  Discuss prior work-up including flow cytometry.  No new medications or herbal products implicated.  Patient denies any history of recurrent infections.  Discuss repeating flow cytometry and T cell gene rearrangement studies if indicated. 3.   Weight loss  Weight was 198 pounds in 10/2019 and 170 pounds in 12/2019.   Weight loss in 10/2019 secondary to dental work and inability to eat solid food.  Weight was 176 pounds last week and 173 pounds today.   Appetite is good.  She denies any nausea, vomiting or diarrhea.  She is scheduled for EGD and colonoscopy.  Abdomen  and pelvis CT with contrast on 08/14/2020 revealed no acute findings.  Continue to monitor as etiology of weight loss may have resolved. 4.   RTC in 1 week for MD assessment, review of work-up and discussion regarding direction of therapy.  I discussed the assessment and treatment plan with the patient.  The patient was provided an opportunity to ask questions and all were answered.  The patient agreed with the plan and demonstrated an understanding of the instructions.  The patient was advised to call back if the symptoms worsen or if the condition fails to improve as anticipated.  I provided 28 minutes of face-to-face time during this this encounter and > 50% was spent counseling as documented under my assessment and plan.  An additional 12-15 minutes were spent reviewing her chart (Epic and Care Everywhere) including notes, labs, and imaging studies.    Delbert Vu C. Mike Gip, MD, PhD    08/28/2020, 3:38 PM  I, Mirian Mo Tufford, am acting as Education administrator for [employer]. Mike Gip, MD, PhD.  I, Olan Kurek C. Mike Gip, MD, have reviewed the above documentation for accuracy and completeness, and I agree with the above.

## 2020-08-28 ENCOUNTER — Encounter: Payer: Self-pay | Admitting: Hematology and Oncology

## 2020-08-28 ENCOUNTER — Other Ambulatory Visit: Payer: Self-pay

## 2020-08-28 ENCOUNTER — Inpatient Hospital Stay: Payer: Medicare Other | Attending: Hematology and Oncology | Admitting: Hematology and Oncology

## 2020-08-28 ENCOUNTER — Inpatient Hospital Stay: Payer: Medicare Other

## 2020-08-28 VITALS — BP 126/88 | HR 76 | Temp 98.9°F | Resp 18 | Wt 173.3 lb

## 2020-08-28 DIAGNOSIS — D72819 Decreased white blood cell count, unspecified: Secondary | ICD-10-CM | POA: Diagnosis not present

## 2020-08-28 DIAGNOSIS — R634 Abnormal weight loss: Secondary | ICD-10-CM | POA: Diagnosis not present

## 2020-08-28 LAB — CBC WITH DIFFERENTIAL/PLATELET
Abs Immature Granulocytes: 0 10*3/uL (ref 0.00–0.07)
Basophils Absolute: 0 10*3/uL (ref 0.0–0.1)
Basophils Relative: 1 %
Eosinophils Absolute: 0.1 10*3/uL (ref 0.0–0.5)
Eosinophils Relative: 1 %
HCT: 38.5 % (ref 36.0–46.0)
Hemoglobin: 13.1 g/dL (ref 12.0–15.0)
Immature Granulocytes: 0 %
Lymphocytes Relative: 51 %
Lymphs Abs: 1.9 10*3/uL (ref 0.7–4.0)
MCH: 30.3 pg (ref 26.0–34.0)
MCHC: 34 g/dL (ref 30.0–36.0)
MCV: 88.9 fL (ref 80.0–100.0)
Monocytes Absolute: 0.4 10*3/uL (ref 0.1–1.0)
Monocytes Relative: 10 %
Neutro Abs: 1.3 10*3/uL — ABNORMAL LOW (ref 1.7–7.7)
Neutrophils Relative %: 37 %
Platelets: 267 10*3/uL (ref 150–400)
RBC: 4.33 MIL/uL (ref 3.87–5.11)
RDW: 13.8 % (ref 11.5–15.5)
WBC: 3.7 10*3/uL — ABNORMAL LOW (ref 4.0–10.5)
nRBC: 0 % (ref 0.0–0.2)

## 2020-08-28 LAB — COMPREHENSIVE METABOLIC PANEL
ALT: 15 U/L (ref 0–44)
AST: 24 U/L (ref 15–41)
Albumin: 3.8 g/dL (ref 3.5–5.0)
Alkaline Phosphatase: 59 U/L (ref 38–126)
Anion gap: 3 — ABNORMAL LOW (ref 5–15)
BUN: 14 mg/dL (ref 8–23)
CO2: 31 mmol/L (ref 22–32)
Calcium: 9.2 mg/dL (ref 8.9–10.3)
Chloride: 102 mmol/L (ref 98–111)
Creatinine, Ser: 0.98 mg/dL (ref 0.44–1.00)
GFR, Estimated: >60 mL/min (ref >60)
Glucose, Bld: 91 mg/dL (ref 70–99)
Potassium: 3.9 mmol/L (ref 3.5–5.1)
Sodium: 136 mmol/L (ref 135–145)
Total Bilirubin: 0.3 mg/dL (ref 0.3–1.2)
Total Protein: 7.5 g/dL (ref 6.5–8.1)

## 2020-08-28 LAB — FOLATE: Folate: 10.7 ng/mL (ref >5.9)

## 2020-08-28 NOTE — Progress Notes (Signed)
Patient here to establish care. Pt has been having unintentional weight loss. Pt reports that she had oral surgery last year and was no able to eat well.

## 2020-08-30 LAB — MULTIPLE MYELOMA PANEL, SERUM
Albumin SerPl Elph-Mcnc: 3.4 g/dL (ref 2.9–4.4)
Albumin/Glob SerPl: 1.1 (ref 0.7–1.7)
Alpha 1: 0.2 g/dL (ref 0.0–0.4)
Alpha2 Glob SerPl Elph-Mcnc: 0.8 g/dL (ref 0.4–1.0)
B-Globulin SerPl Elph-Mcnc: 1.1 g/dL (ref 0.7–1.3)
Gamma Glob SerPl Elph-Mcnc: 1.2 g/dL (ref 0.4–1.8)
Globulin, Total: 3.4 g/dL (ref 2.2–3.9)
IgA: 324 mg/dL (ref 87–352)
IgG (Immunoglobin G), Serum: 1275 mg/dL (ref 586–1602)
IgM (Immunoglobulin M), Srm: 95 mg/dL (ref 26–217)
Total Protein ELP: 6.8 g/dL (ref 6.0–8.5)

## 2020-08-30 LAB — COPPER, SERUM: Copper: 135 ug/dL (ref 80–158)

## 2020-08-31 LAB — COMP PANEL: LEUKEMIA/LYMPHOMA

## 2020-09-03 NOTE — Progress Notes (Incomplete)
[facility]  [address], [address] [place], [place] [address] Phone: [phone]  Fax: [phone]   Clinic Day:  09/03/2020  Referring physician: Toni Arthurs, NP  Chief Complaint: Tanya Pace is a 70 y.o. female with weight loss and neutropenia who is seen for review of work-up and discussion regarding direction of therapy.  HPI: The patient was last seen in the hematology clinic on 08/28/2020 for new patient assessment. At that time, ***.  Work-up revealed a hematocrit of 38.5, hemoglobin 13.1, platelets 267,000, WBC 3,700 (ANC 1,300). Anion gap was 3. Copper was 135. Folate was 10.7.   SPEP was normal. 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 No other significant immunophenotypic abnormality was detected.  During the interim, ***  Past Medical History:  Diagnosis Date  . Anxiety   . Constipation   . Coronary artery disease   . Depression   . Hyperlipidemia   . Hypertension   . Neuromuscular disorder ([place])    peripheral neuropathy  . Pre-diabetes     Past Surgical History:  Procedure Laterality Date  . COLONOSCOPY    . COLONOSCOPY WITH PROPOFOL N/A 03/25/2017   Procedure: COLONOSCOPY WITH PROPOFOL;  Surgeon: Toledo, Benay Pike, MD;  Location: [facility] ENDOSCOPY;  Service: Gastroenterology;  Laterality: N/A;  . DILATION AND CURETTAGE OF UTERUS     x 5-6 [id number]  . POLYPECTOMY      Family History  Problem Relation Age of Onset  . Colon cancer Mother   . Cancer Mother   . Heart disease Father   . Pancreatic cancer Cousin   . Esophageal cancer Neg Hx   . Rectal cancer Neg Hx   . Stomach cancer Neg Hx     Social History:  reports that she quit smoking about 6 years ago. She has a 22.50 pack-year smoking history. She has never used smokeless tobacco. She reports current alcohol use. She reports that she does not use drugs.She quit smoking in 2015. She drinks alcohol once per year. She denies any exposure to  radiation or toxins. She used to work for the [employer]. 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 The patient is alone today.***  Allergies:  Allergies  Allergen Reactions  . Penicillins Other (See Comments)    Gave yeast infection  . Erythromycin Nausea And Vomiting    Current Medications: Current Outpatient Medications  Medication Sig Dispense Refill  . aspirin EC 81 MG tablet Take 81 mg by mouth daily.    . diazepam (VALIUM) 5 MG tablet Take 2.5 mg by mouth daily.    Marland Kitchen gabapentin (NEURONTIN) 300 MG capsule Take 300 mg by mouth 3 (three) times daily. [phone] mg as needed (Patient not taking: Reported on 08/28/2020)    . lisinopril (PRINIVIL,ZESTRIL) 10 MG tablet Take 10 mg by mouth daily. (Patient not taking: Reported on 08/28/2020)    . potassium chloride SA (KLOR-CON) 20 MEQ tablet Take 1 tablet by mouth 2 (two) times daily.    . simvastatin (ZOCOR) 10 MG tablet Take 10 mg by mouth daily.  11  . valACYclovir (VALTREX) 1000 MG tablet Take 1 tablet (1,000 mg total) by mouth 3 (three) times daily. (Patient not taking: Reported on 08/28/2020) 21 tablet 0  . valsartan-hydrochlorothiazide (DIOVAN-HCT) 80-12.5 MG tablet Take 1 tablet by mouth daily.    . verapamil (CALAN-SR) 180 MG CR tablet Take 2 tablets (360 mg total) by mouth at bedtime. (Patient not taking: Reported  on 08/28/2020) 60 tablet 0   No current facility-administered medications for this visit.    Review of Systems  Constitutional: Positive for diaphoresis and weight loss. Negative for chills, fever and malaise/fatigue.  HENT: Negative for congestion, ear discharge, ear pain, hearing loss, nosebleeds, sinus pain, sore throat and tinnitus.   Eyes:       Worsening vision  Respiratory: Negative for cough, hemoptysis, sputum production and shortness of breath.   Cardiovascular: Negative for chest pain, palpitations and leg swelling.  Gastrointestinal: Positive for constipation (occasional).  Negative for abdominal pain, blood in stool, diarrhea, heartburn, melena, nausea and vomiting.  Genitourinary: Negative for dysuria, frequency, hematuria and urgency.  Musculoskeletal: Positive for back pain (constant, underneath bra strap) and joint pain (right hip). Negative for myalgias and neck pain.       Plantar fasciitis.  Skin: Negative for itching and rash.  Neurological: Negative for dizziness, tingling, sensory change, weakness and headaches.  Endo/Heme/Allergies: Bruises/bleeds easily.  Psychiatric/Behavioral: Negative for depression and memory loss. The patient is not nervous/anxious and does not have insomnia.   All other systems reviewed and are negative.  Performance status (ECOG): 1***  Vitals There were no vitals taken for this visit.   Physical Exam Vitals and nursing note reviewed.  Constitutional:      General: She is not in acute distress.    Appearance: She is not diaphoretic.  HENT:     Head: Normocephalic and atraumatic.     Mouth/Throat:     Mouth: Mucous membranes are moist.     Pharynx: Oropharynx is clear.  Eyes:     General: No scleral icterus.    Extraocular Movements: Extraocular movements intact.     Conjunctiva/sclera: Conjunctivae normal.     Pupils: Pupils are equal, round, and reactive to light.  Cardiovascular:     Rate and Rhythm: Normal rate and regular rhythm.     Heart sounds: Normal heart sounds. No murmur heard.   Pulmonary:     Effort: Pulmonary effort is normal. No respiratory distress.     Breath sounds: Normal breath sounds. No wheezing or rales.  Chest:     Chest wall: No tenderness.  Breasts:     Right: No axillary adenopathy or supraclavicular adenopathy.     Left: No axillary adenopathy or supraclavicular adenopathy.    Abdominal:     General: Bowel sounds are normal. There is no distension.     Palpations: Abdomen is soft. There is no mass.     Tenderness: There is no abdominal tenderness. There is no guarding or  rebound.  Musculoskeletal:        General: Tenderness (mid lumbar spine) present. No swelling. Normal range of motion.     Cervical back: Normal range of motion and neck supple.  Lymphadenopathy:     Head:     Right side of head: No preauricular, posterior auricular or occipital adenopathy.     Left side of head: No preauricular, posterior auricular or occipital adenopathy.     Cervical: No cervical adenopathy.     Upper Body:     Right upper body: No supraclavicular or axillary adenopathy.     Left upper body: No supraclavicular or axillary adenopathy.     Lower Body: No right inguinal adenopathy. No left inguinal adenopathy.  Skin:    General: Skin is warm and dry.  Neurological:     Mental Status: She is alert and oriented to person, place, and time.  Psychiatric:  Behavior: Behavior normal.        Thought Content: Thought content normal.        Judgment: Judgment normal.    No visits with results within 3 Day(s) from this visit.  Latest known visit with results is:  Appointment on 08/28/2020  Component Date Value Ref Range Status  . IgG (Immunoglobin G), Serum 08/28/2020 1,275  586 - 1,602 mg/dL Final  . IgA 08/28/2020 324  87 - 352 mg/dL Final  . IgM (Immunoglobulin M), Srm 08/28/2020 95  26 - 217 mg/dL Final  . Total Protein ELP 08/28/2020 6.8  6.0 - 8.5 g/dL Corrected  . Albumin SerPl Elph-Mcnc 08/28/2020 3.4  2.9 - 4.4 g/dL Corrected  . Alpha 1 08/28/2020 0.2  0.0 - 0.4 g/dL Corrected  . Alpha2 Glob SerPl Elph-Mcnc 08/28/2020 0.8  0.4 - 1.0 g/dL Corrected  . B-Globulin SerPl Elph-Mcnc 08/28/2020 1.1  0.7 - 1.3 g/dL Corrected  . Gamma Glob SerPl Elph-Mcnc 08/28/2020 1.2  0.4 - 1.8 g/dL Corrected  . M Protein SerPl Elph-Mcnc 08/28/2020 Not Observed  Not Observed g/dL Corrected  . Globulin, Total 08/28/2020 3.4  2.2 - 3.9 g/dL Corrected  . Albumin/Glob SerPl 08/28/2020 1.1  0.7 - 1.7 Corrected  . IFE 1 08/28/2020 Comment   Corrected   Comment: (NOTE) The  immunofixation pattern appears unremarkable. Evidence of monoclonal protein is not apparent.   . Please Note 08/28/2020 Comment   Corrected   Comment: (NOTE) Protein electrophoresis scan will follow via computer, mail, or courier delivery. Performed At: [facility] [place], [place] [id number] Rush Farmer MD [id number]   . Sodium 08/28/2020 136  135 - 145 mmol/L Final  . Potassium 08/28/2020 3.9  3.5 - 5.1 mmol/L Final  . Chloride 08/28/2020 102  98 - 111 mmol/L Final  . CO2 08/28/2020 31  22 - 32 mmol/L Final  . Glucose, Bld 08/28/2020 91  70 - 99 mg/dL Final   Glucose reference range applies only to samples taken after fasting for at least 8 hours.  . BUN 08/28/2020 14  8 - 23 mg/dL Final  . Creatinine, Ser 08/28/2020 0.98  0.44 - 1.00 mg/dL Final  . Calcium 08/28/2020 9.2  8.9 - 10.3 mg/dL Final  . Total Protein 08/28/2020 7.5  6.5 - 8.1 g/dL Final  . Albumin 08/28/2020 3.8  3.5 - 5.0 g/dL Final  . AST 08/28/2020 24  15 - 41 U/L Final  . ALT 08/28/2020 15  0 - 44 U/L Final  . Alkaline Phosphatase 08/28/2020 59  38 - 126 U/L Final  . Total Bilirubin 08/28/2020 0.3  0.3 - 1.2 mg/dL Final  . GFR, Estimated 08/28/2020 >60  >60 mL/min Final   Comment: (NOTE) Calculated using the CKD-EPI Creatinine Equation (2021)   . Anion gap 08/28/2020 3* 5 - 15 Final   Performed at [facility], [address], [place], [place] [address]  . Copper 08/28/2020 135  80 - 158 ug/dL Final   Comment: (NOTE) This test was developed and its performance characteristics determined by [facility]. It has not been cleared or approved by the Food and Drug Administration.                                Detection Limit = 5 Performed At: [facility] [address] [place], [place] [id number] Rush Farmer MD [id number]   . Folate 08/28/2020 10.7  >5.9 ng/mL Final  Performed at [facility], [address] [place], [place] [address]  . WBC  08/28/2020 3.7* 4.0 - 10.5 K/uL Final  . RBC 08/28/2020 4.33  3.87 - 5.11 MIL/uL Final  . Hemoglobin 08/28/2020 13.1  12.0 - 15.0 g/dL Final  . HCT 08/28/2020 38.5  36.0 - 46.0 % Final  . MCV 08/28/2020 88.9  80.0 - 100.0 fL Final  . MCH 08/28/2020 30.3  26.0 - 34.0 pg Final  . MCHC 08/28/2020 34.0  30.0 - 36.0 g/dL Final  . RDW 08/28/2020 13.8  11.5 - 15.5 % Final  . Platelets 08/28/2020 267  150 - 400 K/uL Final  . nRBC 08/28/2020 0.0  0.0 - 0.2 % Final  . Neutrophils Relative % 08/28/2020 37  % Final  . Neutro Abs 08/28/2020 1.3* 1.7 - 7.7 K/uL Final  . Lymphocytes Relative 08/28/2020 51  % Final  . Lymphs Abs 08/28/2020 1.9  0.7 - 4.0 K/uL Final  . Monocytes Relative 08/28/2020 10  % Final  . Monocytes Absolute 08/28/2020 0.4  0.1 - 1.0 K/uL Final  . Eosinophils Relative 08/28/2020 1  % Final  . Eosinophils Absolute 08/28/2020 0.1  0.0 - 0.5 K/uL Final  . Basophils Relative 08/28/2020 1  % Final  . Basophils Absolute 08/28/2020 0.0  0.0 - 0.1 K/uL Final  . Immature Granulocytes 08/28/2020 0  % Final  . Abs Immature Granulocytes 08/28/2020 0.00  0.00 - 0.07 K/uL Final   Performed at [facility], [address], [place], [place] [address]  . 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 No other significant immunophenotypic abnormality detected   . ANNOTATION COMMENT IMP 08/28/2020 Comment   Corrected   Comment: (NOTE) Loss of CD7 expression in a subset of T cells is non-specific, and can be seen in both reactive and neoplastic conditions.  If clinically indicated, T-cell receptor gene rearrangement studies can be performed on a fresh specimen to confirm or exclude a clonal T-cell process.   Marland Kitchen CLINICAL INFO 08/28/2020 Comment   Corrected   Comment: (NOTE) Accompanying CBC dated 08/28/2020 shows: WBC count 3.7, Hgb 13.1, Plt 267K   . Specimen Type 08/28/2020 Comment   Final   Peripheral  blood  . ASSESSMENT OF LEUKOCYTES 08/28/2020 Comment   Final   Comment: (NOTE) No monoclonal B cell population is detected. kappa:lambda ratio 1.9 CD4:CD8 ratio 2.8 25% of T cells show loss of CD7 expression, a finding can be seen in both reactive and neoplastic conditions. CD57 positive cells are relatively increased and are composed of a mixture of CD4 positive T cells, CD8 positive T cells and NK cells. CD57 is a marker of large granular lymphocytes. Clinical correlation is recommended. No circulating blasts are detected. There is no immunophenotypic  evidence of abnormal myeloid maturation. Analysis of the leukocyte population shows: granulocytes 38%, monocytes 6%, lymphocytes 56%, blasts <0.5%, B cells 10%, T cells 45%, LGLs 16%, NK cells 1%.   . % Viable Cells 08/28/2020 Comment   Corrected   96%  . ANALYSIS AND GATING STRATEGY 08/28/2020 Comment   Final   8 color analysis with CD45/SSC gating  . IMMUNOPHENOTYPING STUDY 08/28/2020 Comment   Final   Comment: (NOTE) CD2       Normal         CD3       Normal CD4       Normal  CD5       Normal CD7       See Text       CD8       Normal CD10      Normal         CD11b     Normal CD13      Normal         CD14      Normal CD16      Normal         CD19      Normal CD20      Normal         CD33      Normal CD34      Normal         CD38      Normal CD45      Normal         CD56      Normal CD57      Normal         CD117     Normal HLA-DR    Normal         KAPPA     Normal LAMBDA    Normal         CD64      Normal   . PATHOLOGIST NAME 08/28/2020 Comment   Final   Smiley Houseman, M.D.  . COMMENT: 08/28/2020 Comment   Corrected   Comment: (NOTE) Each antibody in this assay was utilized to assess for potential abnormalities of studied cell populations or to characterize identified abnormalities. This test was developed and its performance characteristics determined by [facility].  It has not been cleared or approved by the U.[place].  Food and Drug Administration. The FDA has determined that such clearance or approval is not necessary. This test is used for clinical purposes.  It should not be regarded as investigational or for research. Performed At: [facility] [address] [place], [place] [id number] Katina Degree MDPhD NU:[id number] Performed At: [facility] [address] [place], [place] [id number] Katina Degree MDPhD [id number]     Assessment:  Tanya Pace is a 70 y.o. female ***  Symptomatically, ***  Plan: 1.    I discussed the assessment and treatment plan with the patient.  The patient was provided an opportunity to ask questions and all were answered.  The patient agreed with the plan and demonstrated an understanding of the instructions.  The patient was advised to call back if the symptoms worsen or if the condition fails to improve as anticipated.  I provided *** minutes of face-to-face time during this this encounter and > 50% was spent counseling as documented under my assessment and plan.   Melissa C. Mike Gip, MD, PhD    09/03/2020, 2:03 PM  I, Mirian Mo Tufford, am acting as Education administrator for [employer]. Mike Gip, MD, PhD.  {Add scribe attestation statement}

## 2020-09-04 ENCOUNTER — Inpatient Hospital Stay: Payer: Medicare Other | Admitting: Hematology and Oncology

## 2020-09-04 NOTE — Progress Notes (Deleted)
[facility]  [address] [place], [place] [address] Phone: [phone]  Fax: [phone]   Clinic Day:  09/04/2020  Referring physician: Toni Arthurs, NP  Chief Complaint: Tanya Pace is a 70 y.o. female with weight loss and neutropenia who is seen for review of work-up and discussion regarding direction of therapy.  HPI: The patient was last seen in the hematology clinic on 08/28/2020 for new patient assessment. At that time, she reported weight loss since 12/2019.  She was eating well.  Abdomen and pelvis CT on 08/14/2020 revealed no acute findings. Weight appeared to be improving s/p correction of dental issues.  She noted sweats for 20 years. She had never had a mammogram and declined one.   Work-up revealed a hematocrit of 38.5, hemoglobin 13.1, platelets 267,000, WBC 3,700 (ANC 1,300). CMP was normal.  Copper was 135. Folate was 10.7.  SPEP revealed no monoclonal protein; serum immunoglobulins were normal.  She declined HIV testing.  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 No other significant immunophenotypic abnormality was detected.  There was loss of CD7 expression in a subset of T cells, nonspecific, and can be seen in both reactive and neoplastic conditions.  Consider T-cell receptor gene rearrangement studies.  During the interim, ***  Past Medical History:  Diagnosis Date  . Anxiety   . Constipation   . Coronary artery disease   . Depression   . Hyperlipidemia   . Hypertension   . Neuromuscular disorder ([place])    peripheral neuropathy  . Pre-diabetes     Past Surgical History:  Procedure Laterality Date  . COLONOSCOPY    . COLONOSCOPY WITH PROPOFOL N/A 03/25/2017   Procedure: COLONOSCOPY WITH PROPOFOL;  Surgeon: Toledo, Benay Pike, MD;  Location: [facility] ENDOSCOPY;  Service: Gastroenterology;  Laterality: N/A;  . DILATION AND CURETTAGE OF UTERUS     x 5-6 [phone]  . POLYPECTOMY      Family History   Problem Relation Age of Onset  . Colon cancer Mother   . Cancer Mother   . Heart disease Father   . Pancreatic cancer Cousin   . Esophageal cancer Neg Hx   . Rectal cancer Neg Hx   . Stomach cancer Neg Hx     Social History:  reports that she quit smoking about 6 years ago. She has a 22.50 pack-year smoking history. She has never used smokeless tobacco. She reports current alcohol use. She reports that she does not use drugs.She quit smoking in 2015. She drinks alcohol once per year. She denies any exposure to radiation or toxins. She used to work for the [employer]. 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 The patient is alone today.***  Allergies:  Allergies  Allergen Reactions  . Penicillins Other (See Comments)    Gave yeast infection  . Erythromycin Nausea And Vomiting    Current Medications: Current Outpatient Medications  Medication Sig Dispense Refill  . aspirin EC 81 MG tablet Take 81 mg by mouth daily.    . diazepam (VALIUM) 5 MG tablet Take 2.5 mg by mouth daily.    Marland Kitchen gabapentin (NEURONTIN) 300 MG capsule Take 300 mg by mouth 3 (three) times daily. [phone] mg as needed (Patient not taking: Reported on 08/28/2020)    . lisinopril (PRINIVIL,ZESTRIL) 10 MG tablet Take 10 mg by mouth daily. (Patient not taking: Reported on 08/28/2020)    . potassium chloride SA (KLOR-CON) 20 MEQ tablet Take  1 tablet by mouth 2 (two) times daily.    . simvastatin (ZOCOR) 10 MG tablet Take 10 mg by mouth daily.  11  . valACYclovir (VALTREX) 1000 MG tablet Take 1 tablet (1,000 mg total) by mouth 3 (three) times daily. (Patient not taking: Reported on 08/28/2020) 21 tablet 0  . valsartan-hydrochlorothiazide (DIOVAN-HCT) 80-12.5 MG tablet Take 1 tablet by mouth daily.    . verapamil (CALAN-SR) 180 MG CR tablet Take 2 tablets (360 mg total) by mouth at bedtime. (Patient not taking: Reported on 08/28/2020) 60 tablet 0   No current facility-administered medications  for this visit.    Review of Systems  Constitutional: Positive for diaphoresis and weight loss. Negative for chills, fever and malaise/fatigue.  HENT: Negative for congestion, ear discharge, ear pain, hearing loss, nosebleeds, sinus pain, sore throat and tinnitus.   Eyes:       Worsening vision  Respiratory: Negative for cough, hemoptysis, sputum production and shortness of breath.   Cardiovascular: Negative for chest pain, palpitations and leg swelling.  Gastrointestinal: Positive for constipation (occasional). Negative for abdominal pain, blood in stool, diarrhea, heartburn, melena, nausea and vomiting.  Genitourinary: Negative for dysuria, frequency, hematuria and urgency.  Musculoskeletal: Positive for back pain (constant, underneath bra strap) and joint pain (right hip). Negative for myalgias and neck pain.       Plantar fasciitis.  Skin: Negative for itching and rash.  Neurological: Negative for dizziness, tingling, sensory change, weakness and headaches.  Endo/Heme/Allergies: Bruises/bleeds easily.  Psychiatric/Behavioral: Negative for depression and memory loss. The patient is not nervous/anxious and does not have insomnia.   All other systems reviewed and are negative.  Performance status (ECOG): 1***  Vitals There were no vitals taken for this visit.   Physical Exam Vitals and nursing note reviewed.  Constitutional:      General: She is not in acute distress.    Appearance: She is not diaphoretic.  HENT:     Head: Normocephalic and atraumatic.     Mouth/Throat:     Mouth: Mucous membranes are moist.     Pharynx: Oropharynx is clear.  Eyes:     General: No scleral icterus.    Extraocular Movements: Extraocular movements intact.     Conjunctiva/sclera: Conjunctivae normal.     Pupils: Pupils are equal, round, and reactive to light.  Cardiovascular:     Rate and Rhythm: Normal rate and regular rhythm.     Heart sounds: Normal heart sounds. No murmur  heard.   Pulmonary:     Effort: Pulmonary effort is normal. No respiratory distress.     Breath sounds: Normal breath sounds. No wheezing or rales.  Chest:     Chest wall: No tenderness.  Breasts:     Right: No axillary adenopathy or supraclavicular adenopathy.     Left: No axillary adenopathy or supraclavicular adenopathy.    Abdominal:     General: Bowel sounds are normal. There is no distension.     Palpations: Abdomen is soft. There is no mass.     Tenderness: There is no abdominal tenderness. There is no guarding or rebound.  Musculoskeletal:        General: Tenderness (mid lumbar spine) present. No swelling. Normal range of motion.     Cervical back: Normal range of motion and neck supple.  Lymphadenopathy:     Head:     Right side of head: No preauricular, posterior auricular or occipital adenopathy.     Left side of head: No preauricular, posterior  auricular or occipital adenopathy.     Cervical: No cervical adenopathy.     Upper Body:     Right upper body: No supraclavicular or axillary adenopathy.     Left upper body: No supraclavicular or axillary adenopathy.     Lower Body: No right inguinal adenopathy. No left inguinal adenopathy.  Skin:    General: Skin is warm and dry.  Neurological:     Mental Status: She is alert and oriented to person, place, and time.  Psychiatric:        Behavior: Behavior normal.        Thought Content: Thought content normal.        Judgment: Judgment normal.    No visits with results within 3 Day(s) from this visit.  Latest known visit with results is:  Appointment on 08/28/2020  Component Date Value Ref Range Status  . IgG (Immunoglobin G), Serum 08/28/2020 1,275  586 - 1,602 mg/dL Final  . IgA 08/28/2020 324  87 - 352 mg/dL Final  . IgM (Immunoglobulin M), Srm 08/28/2020 95  26 - 217 mg/dL Final  . Total Protein ELP 08/28/2020 6.8  6.0 - 8.5 g/dL Corrected  . Albumin SerPl Elph-Mcnc 08/28/2020 3.4  2.9 - 4.4 g/dL Corrected  .  Alpha 1 08/28/2020 0.2  0.0 - 0.4 g/dL Corrected  . Alpha2 Glob SerPl Elph-Mcnc 08/28/2020 0.8  0.4 - 1.0 g/dL Corrected  . B-Globulin SerPl Elph-Mcnc 08/28/2020 1.1  0.7 - 1.3 g/dL Corrected  . Gamma Glob SerPl Elph-Mcnc 08/28/2020 1.2  0.4 - 1.8 g/dL Corrected  . M Protein SerPl Elph-Mcnc 08/28/2020 Not Observed  Not Observed g/dL Corrected  . Globulin, Total 08/28/2020 3.4  2.2 - 3.9 g/dL Corrected  . Albumin/Glob SerPl 08/28/2020 1.1  0.7 - 1.7 Corrected  . IFE 1 08/28/2020 Comment   Corrected   Comment: (NOTE) The immunofixation pattern appears unremarkable. Evidence of monoclonal protein is not apparent.   . Please Note 08/28/2020 Comment   Corrected   Comment: (NOTE) Protein electrophoresis scan will follow via computer, mail, or courier delivery. Performed At: [facility] [place], [place] [id number] Rush Farmer MD LE:[id number]   . Sodium 08/28/2020 136  135 - 145 mmol/L Final  . Potassium 08/28/2020 3.9  3.5 - 5.1 mmol/L Final  . Chloride 08/28/2020 102  98 - 111 mmol/L Final  . CO2 08/28/2020 31  22 - 32 mmol/L Final  . Glucose, Bld 08/28/2020 91  70 - 99 mg/dL Final   Glucose reference range applies only to samples taken after fasting for at least 8 hours.  . BUN 08/28/2020 14  8 - 23 mg/dL Final  . Creatinine, Ser 08/28/2020 0.98  0.44 - 1.00 mg/dL Final  . Calcium 08/28/2020 9.2  8.9 - 10.3 mg/dL Final  . Total Protein 08/28/2020 7.5  6.5 - 8.1 g/dL Final  . Albumin 08/28/2020 3.8  3.5 - 5.0 g/dL Final  . AST 08/28/2020 24  15 - 41 U/L Final  . ALT 08/28/2020 15  0 - 44 U/L Final  . Alkaline Phosphatase 08/28/2020 59  38 - 126 U/L Final  . Total Bilirubin 08/28/2020 0.3  0.3 - 1.2 mg/dL Final  . GFR, Estimated 08/28/2020 >60  >60 mL/min Final   Comment: (NOTE) Calculated using the CKD-EPI Creatinine Equation (2021)   . Anion gap 08/28/2020 3* 5 - 15 Final   Performed at [facility], [address], [place], [place]  [address]  . Copper 08/28/2020  135  80 - 158 ug/dL Final   Comment: (NOTE) This test was developed and its performance characteristics determined by [facility]. It has not been cleared or approved by the Food and Drug Administration.                                Detection Limit = 5 Performed At: [facility] [address] [place], [place] [id number] Rush Farmer MD MP:[id number]   . Folate 08/28/2020 10.7  >5.9 ng/mL Final   Performed at [facility], [address] [place], [place] [address]  . WBC 08/28/2020 3.7* 4.0 - 10.5 K/uL Final  . RBC 08/28/2020 4.33  3.87 - 5.11 MIL/uL Final  . Hemoglobin 08/28/2020 13.1  12.0 - 15.0 g/dL Final  . HCT 08/28/2020 38.5  36.0 - 46.0 % Final  . MCV 08/28/2020 88.9  80.0 - 100.0 fL Final  . MCH 08/28/2020 30.3  26.0 - 34.0 pg Final  . MCHC 08/28/2020 34.0  30.0 - 36.0 g/dL Final  . RDW 08/28/2020 13.8  11.5 - 15.5 % Final  . Platelets 08/28/2020 267  150 - 400 K/uL Final  . nRBC 08/28/2020 0.0  0.0 - 0.2 % Final  . Neutrophils Relative % 08/28/2020 37  % Final  . Neutro Abs 08/28/2020 1.3* 1.7 - 7.7 K/uL Final  . Lymphocytes Relative 08/28/2020 51  % Final  . Lymphs Abs 08/28/2020 1.9  0.7 - 4.0 K/uL Final  . Monocytes Relative 08/28/2020 10  % Final  . Monocytes Absolute 08/28/2020 0.4  0.1 - 1.0 K/uL Final  . Eosinophils Relative 08/28/2020 1  % Final  . Eosinophils Absolute 08/28/2020 0.1  0.0 - 0.5 K/uL Final  . Basophils Relative 08/28/2020 1  % Final  . Basophils Absolute 08/28/2020 0.0  0.0 - 0.1 K/uL Final  . Immature Granulocytes 08/28/2020 0  % Final  . Abs Immature Granulocytes 08/28/2020 0.00  0.00 - 0.07 K/uL Final   Performed at [facility], [address] [place], [place] [address]  . 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 No other significant immunophenotypic abnormality detected   . ANNOTATION COMMENT IMP  08/28/2020 Comment   Corrected   Comment: (NOTE) Loss of CD7 expression in a subset of T cells is non-specific, and can be seen in both reactive and neoplastic conditions.  If clinically indicated, T-cell receptor gene rearrangement studies can be performed on a fresh specimen to confirm or exclude a clonal T-cell process.   Marland Kitchen CLINICAL INFO 08/28/2020 Comment   Corrected   Comment: (NOTE) Accompanying CBC dated 08/28/2020 shows: WBC count 3.7, Hgb 13.1, Plt 267K   . Specimen Type 08/28/2020 Comment   Final   Peripheral blood  . ASSESSMENT OF LEUKOCYTES 08/28/2020 Comment   Final   Comment: (NOTE) No monoclonal B cell population is detected. kappa:lambda ratio 1.9 CD4:CD8 ratio 2.8 25% of T cells show loss of CD7 expression, a finding can be seen in both reactive and neoplastic conditions. CD57 positive cells are relatively increased and are composed of a mixture of CD4 positive T cells, CD8 positive T cells and NK cells. CD57 is a marker of large granular lymphocytes. Clinical correlation is recommended. No circulating blasts are detected. There is no immunophenotypic  evidence of abnormal myeloid maturation. Analysis of the leukocyte population shows: granulocytes 38%, monocytes 6%, lymphocytes 56%,  blasts <0.5%, B cells 10%, T cells 45%, LGLs 16%, NK cells 1%.   . % Viable Cells 08/28/2020 Comment   Corrected   96%  . ANALYSIS AND GATING STRATEGY 08/28/2020 Comment   Final   8 color analysis with CD45/SSC gating  . IMMUNOPHENOTYPING STUDY 08/28/2020 Comment   Final   Comment: (NOTE) CD2       Normal         CD3       Normal CD4       Normal         CD5       Normal CD7       See Text       CD8       Normal CD10      Normal         CD11b     Normal CD13      Normal         CD14      Normal CD16      Normal         CD19      Normal CD20      Normal         CD33      Normal CD34      Normal         CD38      Normal CD45      Normal         CD56      Normal CD57       Normal         CD117     Normal HLA-DR    Normal         KAPPA     Normal LAMBDA    Normal         CD64      Normal   . PATHOLOGIST NAME 08/28/2020 Comment   Final   Smiley Houseman, M.D.  . COMMENT: 08/28/2020 Comment   Corrected   Comment: (NOTE) Each antibody in this assay was utilized to assess for potential abnormalities of studied cell populations or to characterize identified abnormalities. This test was developed and its performance characteristics determined by [facility].  It has not been cleared or approved by the U.S. Food and Drug Administration. The FDA has determined that such clearance or approval is not necessary. This test is used for clinical purposes.  It should not be regarded as investigational or for research. Performed At: [facility] [address] [place], [place] [id number] Katina Degree MDPhD NI:[id number] Performed At: [facility] [address] [place], [place] [id number] Katina Degree MDPhD QQ:[id number]     Assessment:  Tanya Pace is a 70 y.o. female ***  Work-up on 08/28/2020 revealed a hematocrit of 38.5, hemoglobin 13.1, platelets 267,000, WBC 3,700 (ANC 1,300). CMP was normal.  Copper was 135. Folate was 10.7.  SPEP revealed no monoclonal protein; serum immunoglobulins were normal.  She declined HIV testing.  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 No other significant immunophenotypic abnormality was detected.  There was loss of CD7 expression in a subset of T cells, nonspecific, and can be seen in both reactive and neoplastic conditions.  Consider T-cell receptor gene rearrangement studies.   Symptomatically, ***  Plan: 1.    I discussed the assessment and treatment plan with the patient.  The patient was provided an opportunity to ask questions and all were answered.  The patient agreed with the plan and demonstrated an understanding of the instructions.  The patient was advised to call back if the symptoms worsen or  if the condition fails to improve as anticipated.  I provided *** minutes of face-to-face time during this this encounter and > 50% was spent counseling as documented under my assessment and plan.   Angellynn Kimberlin C. Mike Gip, MD, PhD    09/04/2020, 12:31 PM  I, Lequita Asal, am acting as Education administrator for [employer]. Mike Gip, MD, PhD.  {Add scribe attestation statement}

## 2020-09-13 NOTE — Progress Notes (Addendum)
[facility]  [address], [address] [place], [place] [address] Phone: [phone]  Fax: [phone]   Clinic Day:  09/13/2020  Referring physician: Toni Arthurs, NP  Chief Complaint: Tanya Pace is a 70 y.o. female with weight loss and neutropenia who is seen to review labs.   HPI: The patient was last seen in the hematology clinic on 08/28/2020 for new patient assessment. At that time, she reported weight loss since 12/2019.  She was eating well.  Abdomen and pelvis CT on 08/14/2020 revealed no acute findings. Weight appeared to be improving s/p correction of dental issues.  She noted sweats for 20 years. She had never had a mammogram and declined one.   Work-up revealed a hematocrit of 38.5, hemoglobin 13.1, platelets 267,000, WBC 3,700 (ANC 1,300). CMP was normal.  Copper was 135. Folate was 10.7.  SPEP revealed no monoclonal protein; serum immunoglobulins were normal.  She declined HIV testing.  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 No other significant immunophenotypic abnormality was detected.  There was loss of CD7 expression in a subset of T cells, nonspecific, and can be seen in both reactive and neoplastic conditions.  Consider T-cell receptor gene rearrangement studies.   EGD is scheduled for 10/08/2020 by Dr. Haig Prophet.  During the interim, she has been doing well. Has chronic back pain-left sided that radiates to her hip. Right foot pain d/t plantar fascititis.  Denies any new symptoms. Weight is stable.   Past Medical History:  Diagnosis Date  . Anxiety   . Constipation   . Coronary artery disease   . Depression   . Hyperlipidemia   . Hypertension   . Neuromuscular disorder ([place])    peripheral neuropathy  . Pre-diabetes     Past Surgical History:  Procedure Laterality Date  . COLONOSCOPY    . COLONOSCOPY WITH PROPOFOL N/A 03/25/2017   Procedure: COLONOSCOPY WITH PROPOFOL;  Surgeon: Toledo, Benay Pike, MD;   Location: [facility] ENDOSCOPY;  Service: Gastroenterology;  Laterality: N/A;  . DILATION AND CURETTAGE OF UTERUS     x 5-6 [phone]  . POLYPECTOMY      Family History  Problem Relation Age of Onset  . Colon cancer Mother   . Cancer Mother   . Heart disease Father   . Pancreatic cancer Cousin   . Esophageal cancer Neg Hx   . Rectal cancer Neg Hx   . Stomach cancer Neg Hx     Social History:  reports that she quit smoking about 6 years ago. She has a 22.50 pack-year smoking history. She has never used smokeless tobacco. She reports current alcohol use. She reports that she does not use drugs.She quit smoking in 2015. She drinks alcohol once per year. She denies any exposure to radiation or toxins. She used to work for the [employer]. 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 The patient is alone today.  Allergies:  Allergies  Allergen Reactions  . Penicillins Other (See Comments)    Gave yeast infection  . Erythromycin Nausea And Vomiting    Current Medications: Current Outpatient Medications  Medication Sig Dispense Refill  . aspirin EC 81 MG tablet Take 81 mg by mouth daily.    . diazepam (VALIUM) 5 MG tablet Take 2.5 mg by mouth daily.    Marland Kitchen gabapentin (NEURONTIN) 300 MG capsule Take 300 mg by mouth 3 (three) times daily. [phone] mg as needed (Patient not taking: Reported on 08/28/2020)    .  lisinopril (PRINIVIL,ZESTRIL) 10 MG tablet Take 10 mg by mouth daily. (Patient not taking: Reported on 08/28/2020)    . potassium chloride SA (KLOR-CON) 20 MEQ tablet Take 1 tablet by mouth 2 (two) times daily.    . simvastatin (ZOCOR) 10 MG tablet Take 10 mg by mouth daily.  11  . valACYclovir (VALTREX) 1000 MG tablet Take 1 tablet (1,000 mg total) by mouth 3 (three) times daily. (Patient not taking: Reported on 08/28/2020) 21 tablet 0  . valsartan-hydrochlorothiazide (DIOVAN-HCT) 80-12.5 MG tablet Take 1 tablet by mouth daily.    . verapamil (CALAN-SR) 180  MG CR tablet Take 2 tablets (360 mg total) by mouth at bedtime. (Patient not taking: Reported on 08/28/2020) 60 tablet 0   No current facility-administered medications for this visit.    Review of Systems  Constitutional: Positive for diaphoresis and weight loss. Negative for chills, fever and malaise/fatigue.  HENT: Negative for congestion, ear discharge, ear pain, hearing loss, nosebleeds, sinus pain, sore throat and tinnitus.   Eyes:       Worsening vision  Respiratory: Negative for cough, hemoptysis, sputum production and shortness of breath.   Cardiovascular: Negative for chest pain, palpitations and leg swelling.  Gastrointestinal: Positive for constipation (occasional). Negative for abdominal pain, blood in stool, diarrhea, heartburn, melena, nausea and vomiting.  Genitourinary: Negative for dysuria, frequency, hematuria and urgency.  Musculoskeletal: Positive for back pain (constant, underneath bra strap) and joint pain (right hip). Negative for myalgias and neck pain.       Plantar fasciitis.  Skin: Negative for itching and rash.  Neurological: Negative for dizziness, tingling, sensory change, weakness and headaches.  Endo/Heme/Allergies: Bruises/bleeds easily.  Psychiatric/Behavioral: Negative for depression and memory loss. The patient is not nervous/anxious and does not have insomnia.   All other systems reviewed and are negative.  Performance status (ECOG): 1  Vitals There were no vitals taken for this visit.   Physical Exam Vitals and nursing note reviewed.  Constitutional:      General: She is not in acute distress.    Appearance: She is not diaphoretic.  HENT:     Head: Normocephalic and atraumatic.     Mouth/Throat:     Mouth: Mucous membranes are moist.     Pharynx: Oropharynx is clear.  Eyes:     General: No scleral icterus.    Extraocular Movements: Extraocular movements intact.     Conjunctiva/sclera: Conjunctivae normal.     Pupils: Pupils are equal,  round, and reactive to light.  Cardiovascular:     Rate and Rhythm: Normal rate and regular rhythm.     Heart sounds: Normal heart sounds. No murmur heard.   Pulmonary:     Effort: Pulmonary effort is normal. No respiratory distress.     Breath sounds: Normal breath sounds. No wheezing or rales.  Chest:     Chest wall: No tenderness.  Breasts:     Right: No axillary adenopathy or supraclavicular adenopathy.     Left: No axillary adenopathy or supraclavicular adenopathy.    Abdominal:     General: Bowel sounds are normal. There is no distension.     Palpations: Abdomen is soft. There is no mass.     Tenderness: There is no abdominal tenderness. There is no guarding or rebound.  Musculoskeletal:        General: Tenderness (mid lumbar spine) present. No swelling. Normal range of motion.     Cervical back: Normal range of motion and neck supple.  Lymphadenopathy:  Head:     Right side of head: No preauricular, posterior auricular or occipital adenopathy.     Left side of head: No preauricular, posterior auricular or occipital adenopathy.     Cervical: No cervical adenopathy.     Upper Body:     Right upper body: No supraclavicular or axillary adenopathy.     Left upper body: No supraclavicular or axillary adenopathy.     Lower Body: No right inguinal adenopathy. No left inguinal adenopathy.  Skin:    General: Skin is warm and dry.  Neurological:     Mental Status: She is alert and oriented to person, place, and time.  Psychiatric:        Behavior: Behavior normal.        Thought Content: Thought content normal.        Judgment: Judgment normal.    No visits with results within 3 Day(s) from this visit.  Latest known visit with results is:  Appointment on 08/28/2020  Component Date Value Ref Range Status  . IgG (Immunoglobin G), Serum 08/28/2020 1,275  586 - 1,602 mg/dL Final  . IgA 08/28/2020 324  87 - 352 mg/dL Final  . IgM (Immunoglobulin M), Srm 08/28/2020 95  26 -  217 mg/dL Final  . Total Protein ELP 08/28/2020 6.8  6.0 - 8.5 g/dL Corrected  . Albumin SerPl Elph-Mcnc 08/28/2020 3.4  2.9 - 4.4 g/dL Corrected  . Alpha 1 08/28/2020 0.2  0.0 - 0.4 g/dL Corrected  . Alpha2 Glob SerPl Elph-Mcnc 08/28/2020 0.8  0.4 - 1.0 g/dL Corrected  . B-Globulin SerPl Elph-Mcnc 08/28/2020 1.1  0.7 - 1.3 g/dL Corrected  . Gamma Glob SerPl Elph-Mcnc 08/28/2020 1.2  0.4 - 1.8 g/dL Corrected  . M Protein SerPl Elph-Mcnc 08/28/2020 Not Observed  Not Observed g/dL Corrected  . Globulin, Total 08/28/2020 3.4  2.2 - 3.9 g/dL Corrected  . Albumin/Glob SerPl 08/28/2020 1.1  0.7 - 1.7 Corrected  . IFE 1 08/28/2020 Comment   Corrected   Comment: (NOTE) The immunofixation pattern appears unremarkable. Evidence of monoclonal protein is not apparent.   . Please Note 08/28/2020 Comment   Corrected   Comment: (NOTE) Protein electrophoresis scan will follow via computer, mail, or courier delivery. Performed At: [facility] [place], [place] [id number] Rush Farmer MD QJ:[id number]   . Sodium 08/28/2020 136  135 - 145 mmol/L Final  . Potassium 08/28/2020 3.9  3.5 - 5.1 mmol/L Final  . Chloride 08/28/2020 102  98 - 111 mmol/L Final  . CO2 08/28/2020 31  22 - 32 mmol/L Final  . Glucose, Bld 08/28/2020 91  70 - 99 mg/dL Final   Glucose reference range applies only to samples taken after fasting for at least 8 hours.  . BUN 08/28/2020 14  8 - 23 mg/dL Final  . Creatinine, Ser 08/28/2020 0.98  0.44 - 1.00 mg/dL Final  . Calcium 08/28/2020 9.2  8.9 - 10.3 mg/dL Final  . Total Protein 08/28/2020 7.5  6.5 - 8.1 g/dL Final  . Albumin 08/28/2020 3.8  3.5 - 5.0 g/dL Final  . AST 08/28/2020 24  15 - 41 U/L Final  . ALT 08/28/2020 15  0 - 44 U/L Final  . Alkaline Phosphatase 08/28/2020 59  38 - 126 U/L Final  . Total Bilirubin 08/28/2020 0.3  0.3 - 1.2 mg/dL Final  . GFR, Estimated 08/28/2020 >60  >60 mL/min Final   Comment: (NOTE) Calculated using the CKD-EPI  Creatinine Equation (2021)   .  Anion gap 08/28/2020 3* 5 - 15 Final   Performed at [facility], [address], [place], [place] [address]  . Copper 08/28/2020 135  80 - 158 ug/dL Final   Comment: (NOTE) This test was developed and its performance characteristics determined by [facility]. It has not been cleared or approved by the Food and Drug Administration.                                Detection Limit = 5 Performed At: [facility] [address] [place], [place] [id number] Rush Farmer MD [id number]   . Folate 08/28/2020 10.7  >5.9 ng/mL Final   Performed at [facility], [address] [place], [place] [address]  . WBC 08/28/2020 3.7* 4.0 - 10.5 K/uL Final  . RBC 08/28/2020 4.33  3.87 - 5.11 MIL/uL Final  . Hemoglobin 08/28/2020 13.1  12.0 - 15.0 g/dL Final  . HCT 08/28/2020 38.5  36.0 - 46.0 % Final  . MCV 08/28/2020 88.9  80.0 - 100.0 fL Final  . MCH 08/28/2020 30.3  26.0 - 34.0 pg Final  . MCHC 08/28/2020 34.0  30.0 - 36.0 g/dL Final  . RDW 08/28/2020 13.8  11.5 - 15.5 % Final  . Platelets 08/28/2020 267  150 - 400 K/uL Final  . nRBC 08/28/2020 0.0  0.0 - 0.2 % Final  . Neutrophils Relative % 08/28/2020 37  % Final  . Neutro Abs 08/28/2020 1.3* 1.7 - 7.7 K/uL Final  . Lymphocytes Relative 08/28/2020 51  % Final  . Lymphs Abs 08/28/2020 1.9  0.7 - 4.0 K/uL Final  . Monocytes Relative 08/28/2020 10  % Final  . Monocytes Absolute 08/28/2020 0.4  0.1 - 1.0 K/uL Final  . Eosinophils Relative 08/28/2020 1  % Final  . Eosinophils Absolute 08/28/2020 0.1  0.0 - 0.5 K/uL Final  . Basophils Relative 08/28/2020 1  % Final  . Basophils Absolute 08/28/2020 0.0  0.0 - 0.1 K/uL Final  . Immature Granulocytes 08/28/2020 0  % Final  . Abs Immature Granulocytes 08/28/2020 0.00  0.00 - 0.07 K/uL Final   Performed at [facility], [address] [place], [place] [address]  . PATH INTERP XXX-IMP 08/28/2020 Comment   Final   Comment:  (NOTE) CD7 negative T cell phenotype detected, see comment. No other significant immunophenotypic abnormality detected   . ANNOTATION COMMENT IMP 08/28/2020 Comment   Corrected   Comment: (NOTE) Loss of CD7 expression in a subset of T cells is non-specific, and can be seen in both reactive and neoplastic conditions.  If clinically indicated, T-cell receptor gene rearrangement studies can be performed on a fresh specimen to confirm or exclude a clonal T-cell process.   Marland Kitchen CLINICAL INFO 08/28/2020 Comment   Corrected   Comment: (NOTE) Accompanying CBC dated 08/28/2020 shows: WBC count 3.7, Hgb 13.1, Plt 267K   . Specimen Type 08/28/2020 Comment   Final   Peripheral blood  . ASSESSMENT OF LEUKOCYTES 08/28/2020 Comment   Final   Comment: (NOTE) No monoclonal B cell population is detected. kappa:lambda ratio 1.9 CD4:CD8 ratio 2.8 25% of T cells show loss of CD7 expression, a finding can be seen in both reactive and neoplastic conditions. CD57 positive cells are relatively increased and are composed of a mixture of CD4 positive T cells, CD8 positive T cells and NK cells. CD57 is a marker of large granular lymphocytes. Clinical correlation is recommended.  No circulating blasts are detected. There is no immunophenotypic  evidence of abnormal myeloid maturation. Analysis of the leukocyte population shows: granulocytes 38%, monocytes 6%, lymphocytes 56%, blasts <0.5%, B cells 10%, T cells 45%, LGLs 16%, NK cells 1%.   . % Viable Cells 08/28/2020 Comment   Corrected   96%  . ANALYSIS AND GATING STRATEGY 08/28/2020 Comment   Final   8 color analysis with CD45/SSC gating  . IMMUNOPHENOTYPING STUDY 08/28/2020 Comment   Final   Comment: (NOTE) CD2       Normal         CD3       Normal CD4       Normal         CD5       Normal CD7       See Text       CD8       Normal CD10      Normal         CD11b     Normal CD13      Normal         CD14      Normal CD16      Normal         CD19       Normal CD20      Normal         CD33      Normal CD34      Normal         CD38      Normal CD45      Normal         CD56      Normal CD57      Normal         CD117     Normal HLA-DR    Normal         KAPPA     Normal LAMBDA    Normal         CD64      Normal   . PATHOLOGIST NAME 08/28/2020 Comment   Final   Smiley Houseman, M.D.  . COMMENT: 08/28/2020 Comment   Corrected   Comment: (NOTE) Each antibody in this assay was utilized to assess for potential abnormalities of studied cell populations or to characterize identified abnormalities. This test was developed and its performance characteristics determined by [facility].  It has not been cleared or approved by the U.S. Food and Drug Administration. The FDA has determined that such clearance or approval is not necessary. This test is used for clinical purposes.  It should not be regarded as investigational or for research. Performed At: [facility] [address] [place], [place] [id number] Katina Degree MDPhD HE:[id number] Performed At: [facility] [address] [place], [place] [id number] Katina Degree MDPhD YO:[id number]     Assessment:  Tanya Pace is a 70 y.o. female who presents for follow-up   Work-up on 08/28/2020 revealed a hematocrit of 38.5, hemoglobin 13.1, platelets 267,000, WBC 3,700 (ANC 1,300). CMP was normal.  Copper was 135. Folate was 10.7.  SPEP revealed no monoclonal protein; serum immunoglobulins were normal.  She declined HIV testing.  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 No other significant immunophenotypic abnormality was detected.  There was loss of CD7 expression in a subset of T cells, nonspecific, and can be seen in both reactive and neoplastic conditions.  Consider T-cell receptor gene rearrangement studies.  Symptomatically, she is  doing well.  She denies any recent infections.  She is scheduled for a EGD in May 2022 for weight loss. Mild pelvic pressure. Does not have  gynecologist.   Plan: 1.   Neutropenia  Labs from 08/28/2020 show a WBC of 3700 and ANC of 1300- stable from previous  Flow cytometry was negative.  No nutritional deficiencies.              No recent infections.  No new medications per patient.               2.   Weight loss  Weight appears to have stabilized.  She weighs 177 pounds today previously 173 pounds (4 pound weight gain) as of 08/28/2020.  Continues to have a good appetite denies any nausea or vomiting.                         She is scheduled for EGD next month.              Abdomen and pelvis CT with contrast on 08/14/2020 revealed no acute findings.             Continue to monitor as etiology of weight loss may have resolved.  3.  Incidental uterine findings  CT abdomen showed lobular uterine contour with dystrophic calcifications which is poorly evaluated on CT but likely representing uterine leiomyomas.  Recommend pelvic ultrasound if clinically indicated.  Patient would like to follow-up with pelvic ultrasound given she is having some pelvic pressure.  She has not followed by a gynecologist.  Denies any bleeding.  This is scheduled for 10/09/20.   Disposition: Pelvic ultrasound scheduled for 10/09/2020. Has EGD scheduled for 10/09/2018 with Dr. Haig Prophet. RTC in 3 months for follow-up with labs and MD assessment.    I discussed the assessment and treatment plan with the patient.  The patient was provided an opportunity to ask questions and all were answered.  The patient agreed with the plan and demonstrated an understanding of the instructions.  The patient was advised to call back if the symptoms worsen or if the condition fails to improve as anticipated.  Greater than 50% was spent in counseling and coordination of care with this patient including but not limited to discussion of the relevant topics above (See A&P) including, but not limited to diagnosis and management of acute and chronic medical conditions.   Faythe Casa, NP 10/02/2020 12:41 PM

## 2020-09-17 ENCOUNTER — Encounter: Payer: Self-pay | Admitting: Oncology

## 2020-09-17 ENCOUNTER — Other Ambulatory Visit: Payer: Self-pay

## 2020-09-17 ENCOUNTER — Inpatient Hospital Stay (HOSPITAL_BASED_OUTPATIENT_CLINIC_OR_DEPARTMENT_OTHER): Payer: Medicare Other | Admitting: Oncology

## 2020-09-17 VITALS — BP 106/70 | HR 89 | Temp 96.6°F | Resp 18 | Wt 177.5 lb

## 2020-09-17 DIAGNOSIS — R634 Abnormal weight loss: Secondary | ICD-10-CM | POA: Diagnosis not present

## 2020-09-17 DIAGNOSIS — R9389 Abnormal findings on diagnostic imaging of other specified body structures: Secondary | ICD-10-CM

## 2020-09-17 DIAGNOSIS — D72819 Decreased white blood cell count, unspecified: Secondary | ICD-10-CM | POA: Diagnosis present

## 2020-09-17 NOTE — Progress Notes (Signed)
Patient here for oncology follow-up appointment, expresses concerns of back pain

## 2020-09-18 ENCOUNTER — Ambulatory Visit: Payer: Medicare Other | Admitting: Oncology

## 2020-10-02 ENCOUNTER — Other Ambulatory Visit: Payer: Self-pay | Admitting: Oncology

## 2020-10-02 DIAGNOSIS — R9389 Abnormal findings on diagnostic imaging of other specified body structures: Secondary | ICD-10-CM

## 2020-10-02 DIAGNOSIS — N858 Other specified noninflammatory disorders of uterus: Secondary | ICD-10-CM

## 2020-10-08 ENCOUNTER — Ambulatory Visit
Admission: RE | Admit: 2020-10-08 | Discharge: 2020-10-08 | Disposition: A | Discharge status: Home / Self Care | Payer: Medicare Other | Source: Ambulatory Visit | Attending: Gastroenterology | Admitting: Gastroenterology

## 2020-10-08 ENCOUNTER — Ambulatory Visit: Payer: Medicare Other | Admitting: Anesthesiology

## 2020-10-08 ENCOUNTER — Other Ambulatory Visit: Payer: Self-pay

## 2020-10-08 ENCOUNTER — Encounter
Admission: RE | Disposition: A | Discharge status: Home / Self Care | Payer: Self-pay | Source: Ambulatory Visit | Attending: Gastroenterology

## 2020-10-08 DIAGNOSIS — Z800 Family history of malignant neoplasm of digestive organs: Secondary | ICD-10-CM | POA: Insufficient documentation

## 2020-10-08 DIAGNOSIS — K2970 Gastritis, unspecified, without bleeding: Secondary | ICD-10-CM | POA: Insufficient documentation

## 2020-10-08 DIAGNOSIS — Z7982 Long term (current) use of aspirin: Secondary | ICD-10-CM | POA: Diagnosis not present

## 2020-10-08 DIAGNOSIS — D125 Benign neoplasm of sigmoid colon: Secondary | ICD-10-CM | POA: Insufficient documentation

## 2020-10-08 DIAGNOSIS — R634 Abnormal weight loss: Secondary | ICD-10-CM | POA: Diagnosis not present

## 2020-10-08 DIAGNOSIS — Z880 Allergy status to penicillin: Secondary | ICD-10-CM | POA: Diagnosis not present

## 2020-10-08 DIAGNOSIS — K449 Diaphragmatic hernia without obstruction or gangrene: Secondary | ICD-10-CM | POA: Insufficient documentation

## 2020-10-08 DIAGNOSIS — K2980 Duodenitis without bleeding: Secondary | ICD-10-CM | POA: Insufficient documentation

## 2020-10-08 DIAGNOSIS — I10 Essential (primary) hypertension: Secondary | ICD-10-CM | POA: Diagnosis not present

## 2020-10-08 DIAGNOSIS — Z881 Allergy status to other antibiotic agents status: Secondary | ICD-10-CM | POA: Insufficient documentation

## 2020-10-08 DIAGNOSIS — Z6825 Body mass index (BMI) 25.0-25.9, adult: Secondary | ICD-10-CM | POA: Insufficient documentation

## 2020-10-08 DIAGNOSIS — K640 First degree hemorrhoids: Secondary | ICD-10-CM | POA: Insufficient documentation

## 2020-10-08 DIAGNOSIS — Z79899 Other long term (current) drug therapy: Secondary | ICD-10-CM | POA: Insufficient documentation

## 2020-10-08 DIAGNOSIS — Z87891 Personal history of nicotine dependence: Secondary | ICD-10-CM | POA: Insufficient documentation

## 2020-10-08 HISTORY — PX: ESOPHAGOGASTRODUODENOSCOPY: SHX5428

## 2020-10-08 HISTORY — PX: COLONOSCOPY WITH PROPOFOL: SHX5780

## 2020-10-08 SURGERY — EGD (ESOPHAGOGASTRODUODENOSCOPY)
Anesthesia: General

## 2020-10-08 MED ORDER — LIDOCAINE HCL (CARDIAC) PF 100 MG/5ML IV SOSY
PREFILLED_SYRINGE | INTRAVENOUS | Status: DC | PRN
  Administered 2020-10-08: 50 mg via INTRAVENOUS

## 2020-10-08 MED ORDER — FENTANYL CITRATE (PF) 100 MCG/2ML IJ SOLN
INTRAMUSCULAR | Status: AC
  Filled 2020-10-08: qty 2

## 2020-10-08 MED ORDER — PROPOFOL 500 MG/50ML IV EMUL
INTRAVENOUS | Status: DC | PRN
  Administered 2020-10-08: 120 ug/kg/min via INTRAVENOUS

## 2020-10-08 MED ORDER — FENTANYL CITRATE (PF) 100 MCG/2ML IJ SOLN
INTRAMUSCULAR | Status: DC | PRN
  Administered 2020-10-08 (×2): 25 ug via INTRAVENOUS

## 2020-10-08 MED ORDER — PROPOFOL 500 MG/50ML IV EMUL
INTRAVENOUS | Status: AC
  Filled 2020-10-08: qty 50

## 2020-10-08 MED ORDER — SODIUM CHLORIDE 0.9 % IV SOLN: INTRAVENOUS | Status: DC

## 2020-10-08 MED ORDER — LIDOCAINE HCL (PF) 2 % IJ SOLN
INTRAMUSCULAR | Status: AC
  Filled 2020-10-08: qty 4

## 2020-10-08 NOTE — Interval H&P Note (Signed)
History and Physical Interval Note:  10/08/2020 1:55 PM  Tanya Pace  has presented today for surgery, with the diagnosis of [place] HABITS,WT.LOSS.  The various methods of treatment have been discussed with the patient and family. After consideration of risks, benefits and other options for treatment, the patient has consented to  Procedure(s) with comments: ESOPHAGOGASTRODUODENOSCOPY (EGD) (N/A) - REQUESTS LAST CASE OF DAY COLONOSCOPY WITH PROPOFOL (N/A) as a surgical intervention.  The patient's history has been reviewed, patient examined, no change in status, stable for surgery.  I have reviewed the patient's chart and labs.  Questions were answered to the patient's satisfaction.     Tanya Pace  Ok to proceed with EGD/Colonoscopy

## 2020-10-08 NOTE — Anesthesia Postprocedure Evaluation (Signed)
Anesthesia Post Note  Patient: Tanya Pace  Procedure(s) Performed: ESOPHAGOGASTRODUODENOSCOPY (EGD) (N/A ) COLONOSCOPY WITH PROPOFOL (N/A )  Patient location during evaluation: Endoscopy Anesthesia Type: General Level of consciousness: awake and alert Pain management: pain level controlled Vital Signs Assessment: post-procedure vital signs reviewed and stable Respiratory status: spontaneous breathing, nonlabored ventilation, respiratory function stable and patient connected to nasal cannula oxygen Cardiovascular status: blood pressure returned to baseline and stable Postop Assessment: no apparent nausea or vomiting Anesthetic complications: no   No complications documented.   Last Vitals:  Vitals:   10/08/20 1436 10/08/20 1446  BP: 113/69 (!) 147/72  Pulse: 93 89  Resp: (!) 22 (!) 21  Temp: (!) 35.8 C   SpO2: 100% 99%    Last Pain:  Vitals:   10/08/20 1446  TempSrc:   PainSc: 0-No pain                 Martha Clan

## 2020-10-08 NOTE — Anesthesia Preprocedure Evaluation (Signed)
Anesthesia Evaluation  Patient identified by MRN, date of birth, ID band Patient awake    Reviewed: Allergy & Precautions, NPO status , Patient's Chart, lab work & pertinent test results  History of Anesthesia Complications Negative for: history of anesthetic complications  Airway Mallampati: II       Dental  (+) Teeth Intact, Dental Advidsory Given, Caps, Missing   Pulmonary neg pulmonary ROS, former smoker,     + decreased breath sounds      Cardiovascular Exercise Tolerance: Good hypertension, Pt. on medications (-) angina+ CAD and + Peripheral Vascular Disease  (-) dysrhythmias (-) Valvular Problems/Murmurs Rhythm:Regular     Neuro/Psych neg Seizures PSYCHIATRIC DISORDERS Anxiety Depression  Neuromuscular disease    GI/Hepatic negative GI ROS, Neg liver ROS,   Endo/Other  negative endocrine ROSneg diabetes  Renal/GU negative Renal ROS     Musculoskeletal   Abdominal (+) + obese,   Peds negative pediatric ROS (+)  Hematology negative hematology ROS (+)   Anesthesia Other Findings Past Medical History: No date: Anxiety No date: Constipation No date: Coronary artery disease No date: Depression No date: Hyperlipidemia No date: Hypertension No date: Neuromuscular disorder ([facility])     Comment:  peripheral neuropathy No date: Pre-diabetes   Reproductive/Obstetrics                             Anesthesia Physical  Anesthesia Plan  ASA: II  Anesthesia Plan: General   Post-op Pain Management:    Induction: Intravenous  PONV Risk Score and Plan: 2 and TIVA and Propofol infusion  Airway Management Planned: Natural Airway and Nasal Cannula  Additional Equipment:   Intra-op Plan:   Post-operative Plan:   Informed Consent: I have reviewed the patients History and Physical, chart, labs and discussed the procedure including the risks, benefits and alternatives for the proposed  anesthesia with the patient or authorized representative who has indicated his/her understanding and acceptance.       Plan Discussed with: CRNA  Anesthesia Plan Comments:         Anesthesia Quick Evaluation

## 2020-10-08 NOTE — Op Note (Signed)
[facility] Gastroenterology Patient Name: Tanya Pace Procedure Date: 10/08/2020 1:45 PM MRN: [id number] Account #: [id number] Date of Birth: 1950-06-18 Admit Type: Outpatient Age: 70 Room: [facility] ENDO ROOM 3 Gender: Female Note Status: Finalized Procedure:             Upper GI endoscopy Indications:           Dyspepsia, Weight loss Providers:             Andrey Farmer MD, MD Referring MD:          Toni Arthurs (Referring MD) Medicines:             Monitored Anesthesia Care Complications:         No immediate complications. Estimated blood loss:                         Minimal. Procedure:             Pre-Anesthesia Assessment:                        - Prior to the procedure, a History and Physical was                         performed, and patient medications and allergies were                         reviewed. The patient is competent. The risks and                         benefits of the procedure and the sedation options and                         risks were discussed with the patient. All questions                         were answered and informed consent was obtained.                         Patient identification and proposed procedure were                         verified by the physician, the nurse, the anesthetist                         and the technician in the endoscopy suite. Mental                         Status Examination: alert and oriented. Airway                         Examination: normal oropharyngeal airway and neck                         mobility. Respiratory Examination: clear to                         auscultation. CV Examination: normal. Prophylactic                         Antibiotics:  The patient does not require prophylactic                         antibiotics. Prior Anticoagulants: The patient has                         taken no previous anticoagulant or antiplatelet                         agents. ASA Grade Assessment:  II - A patient with mild                         systemic disease. After reviewing the risks and                         benefits, the patient was deemed in satisfactory                         condition to undergo the procedure. The anesthesia                         plan was to use monitored anesthesia care (MAC).                         Immediately prior to administration of medications,                         the patient was re-assessed for adequacy to receive                         sedatives. The heart rate, respiratory rate, oxygen                         saturations, blood pressure, adequacy of pulmonary                         ventilation, and response to care were monitored                         throughout the procedure. The physical status of the                         patient was re-assessed after the procedure.                        After obtaining informed consent, the endoscope was                         passed under direct vision. Throughout the procedure,                         the patient's blood pressure, pulse, and oxygen                         saturations were monitored continuously. The Endoscope                         was introduced through the mouth, and advanced to the  second part of duodenum. The upper GI endoscopy was                         accomplished without difficulty. The patient tolerated                         the procedure well. Findings:      A small hiatal hernia was present.      The exam of the esophagus was otherwise normal.      Scattered severe inflammation characterized by erosions and erythema was       found in the gastric fundus and in the gastric antrum. Biopsies were       taken with a cold forceps for Helicobacter pylori testing. Estimated       blood loss was minimal.      Patchy moderately erythematous mucosa without active bleeding and with       no stigmata of bleeding was found in the duodenal bulb.  Biopsies were       taken with a cold forceps for histology. Estimated blood loss was       minimal.      The exam of the duodenum was otherwise normal. Impression:            - Small hiatal hernia.                        - Gastritis. Biopsied.                        - Erythematous duodenopathy. Biopsied. Recommendation:        - Perform a colonoscopy today.                        - No ibuprofen, naproxen, or other non-steroidal                         anti-inflammatory drugs.                        - Use Protonix (pantoprazole) 40 mg PO daily. Procedure Code(s):     --- Professional ---                        [phone], Esophagogastroduodenoscopy, flexible,                         transoral; with biopsy, single or multiple Diagnosis Code(s):     --- Professional ---                        K44.9, Diaphragmatic hernia without obstruction or                         gangrene                        K29.70, Gastritis, unspecified, without bleeding                        K31.89, Other diseases of stomach and duodenum                        R10.13, Epigastric pain  R63.4, Abnormal weight loss CPT copyright 2019 [employer]. All rights reserved. The codes documented in this report are preliminary and upon coder review may  be revised to meet current compliance requirements. Andrey Farmer MD, MD 10/08/2020 2:36:57 PM Number of Addenda: 0 Note Initiated On: 10/08/2020 1:45 PM Estimated Blood Loss:  Estimated blood loss was minimal.      [facility]

## 2020-10-08 NOTE — Op Note (Signed)
[facility] Gastroenterology Patient Name: Tanya Pace Procedure Date: 10/08/2020 1:45 PM MRN: [id number] Account #: [id number] Date of Birth: 15-Aug-1950 Admit Type: Outpatient Age: 70 Room: [facility] ENDO ROOM 3 Gender: Female Note Status: Finalized Procedure:             Colonoscopy Indications:           Weight loss Providers:             Andrey Farmer MD, MD Referring MD:          Toni Arthurs (Referring MD) Medicines:             Monitored Anesthesia Care Complications:         No immediate complications. Estimated blood loss:                         Minimal. Procedure:             Pre-Anesthesia Assessment:                        - Prior to the procedure, a History and Physical was                         performed, and patient medications and allergies were                         reviewed. The patient is competent. The risks and                         benefits of the procedure and the sedation options and                         risks were discussed with the patient. All questions                         were answered and informed consent was obtained.                         Patient identification and proposed procedure were                         verified by the physician, the nurse, the anesthetist                         and the technician in the endoscopy suite. Mental                         Status Examination: alert and oriented. Airway                         Examination: normal oropharyngeal airway and neck                         mobility. Respiratory Examination: clear to                         auscultation. CV Examination: normal. Prophylactic                         Antibiotics: The patient does  not require prophylactic                         antibiotics. Prior Anticoagulants: The patient has                         taken no previous anticoagulant or antiplatelet                         agents. ASA Grade Assessment: II - A patient  with mild                         systemic disease. After reviewing the risks and                         benefits, the patient was deemed in satisfactory                         condition to undergo the procedure. The anesthesia                         plan was to use monitored anesthesia care (MAC).                         Immediately prior to administration of medications,                         the patient was re-assessed for adequacy to receive                         sedatives. The heart rate, respiratory rate, oxygen                         saturations, blood pressure, adequacy of pulmonary                         ventilation, and response to care were monitored                         throughout the procedure. The physical status of the                         patient was re-assessed after the procedure.                        After obtaining informed consent, the colonoscope was                         passed under direct vision. Throughout the procedure,                         the patient's blood pressure, pulse, and oxygen                         saturations were monitored continuously. The                         Colonoscope was introduced through the anus and  advanced to the the terminal ileum. The colonoscopy                         was performed without difficulty. The patient                         tolerated the procedure well. The quality of the bowel                         preparation was good. Findings:      The perianal and digital rectal examinations were normal.      The terminal ileum appeared normal.      The lumen of the colon (entire examined portion) was moderately dilated.      A 4 mm polyp was found in the sigmoid colon. The polyp was sessile. The       polyp was removed with a cold snare. Resection and retrieval were       complete. Estimated blood loss was minimal.      Internal hemorrhoids were found during retroflexion. The  hemorrhoids       were Grade I (internal hemorrhoids that do not prolapse).      The exam was otherwise without abnormality on direct and retroflexion       views. Impression:            - The examined portion of the ileum was normal.                        - Dilated in the entire examined colon.                        - One 4 mm polyp in the sigmoid colon, removed with a                         cold snare. Resected and retrieved.                        - Internal hemorrhoids.                        - The examination was otherwise normal on direct and                         retroflexion views. Recommendation:        - Discharge patient to home.                        - Resume previous diet.                        - Continue present medications.                        - Await pathology results.                        - Repeat colonoscopy for surveillance based on                         pathology results.                        -  Return to referring physician as previously                         scheduled. Procedure Code(s):     --- Professional ---                        [phone], Colonoscopy, flexible; with removal of                         tumor(s), polyp(s), or other lesion(s) by snare                         technique Diagnosis Code(s):     --- Professional ---                        K64.0, First degree hemorrhoids                        K59.39, Other megacolon                        K63.5, Polyp of colon                        R63.4, Abnormal weight loss CPT copyright 2019 American Medical Association. All rights reserved. The codes documented in this report are preliminary and upon coder review may  be revised to meet current compliance requirements. Andrey Farmer MD, MD 10/08/2020 2:39:43 PM Number of Addenda: 0 Note Initiated On: 10/08/2020 1:45 PM Scope Withdrawal Time: 0 hours 9 minutes 44 seconds  Total Procedure Duration: 0 hours 18 minutes 10 seconds  Estimated  Blood Loss:  Estimated blood loss was minimal.      [facility]

## 2020-10-08 NOTE — Anesthesia Procedure Notes (Signed)
Performed by: Cook-Martin, Alizia Greif Pre-anesthesia Checklist: Patient identified, Emergency Drugs available, Suction available, Patient being monitored and Timeout performed Patient Re-evaluated:Patient Re-evaluated prior to induction Oxygen Delivery Method: Nasal cannula Preoxygenation: Pre-oxygenation with 100% oxygen Induction Type: IV induction Airway Equipment and Method: Bite block Placement Confirmation: positive ETCO2 and CO2 detector       

## 2020-10-08 NOTE — H&P (Signed)
Outpatient short stay form Pre-procedure 10/08/2020 1:52 PM Tanya Miyamoto MD, MPH  Primary Physician: NP Payton Mccallum  Reason for visit:  Weight loss  History of present illness:   70 y/o lady with history of hypertension here for EGD/Colonoscopy for weight loss. Had normal colonoscopy in 2018. State mother had colon cancer in her 25's. No blood thinners. No abdominal surgeries.    Current Facility-Administered Medications:  .  0.9 %  sodium chloride infusion, , Intravenous, Continuous, Bambie Pizzolato, Hilton Cork, MD, Last Rate: 20 mL/hr at 10/08/20 1318, New Bag at 10/08/20 1318  Medications Prior to Admission  Medication Sig Dispense Refill Last Dose  . Cholecalciferol (VITAMIN D3) 50 MCG (2000 UT) capsule Take 2,000 Units by mouth daily.   Past Week at Unknown time  . diazepam (VALIUM) 5 MG tablet Take 2.5 mg by mouth daily.   10/08/2020 at Unknown time  . potassium chloride SA (KLOR-CON) 20 MEQ tablet Take 1 tablet by mouth 2 (two) times daily.   10/08/2020 at Unknown time  . simvastatin (ZOCOR) 10 MG tablet Take 10 mg by mouth daily.  11 10/07/2020 at Unknown time  . valsartan-hydrochlorothiazide (DIOVAN-HCT) 80-12.5 MG tablet Take 1 tablet by mouth daily.   10/08/2020 at Unknown time  . verapamil (CALAN-SR) 180 MG CR tablet Take 2 tablets (360 mg total) by mouth at bedtime. 60 tablet 0 10/08/2020 at Unknown time  . aspirin EC 81 MG tablet Take 81 mg by mouth daily. (Patient not taking: Reported on 09/17/2020)     . gabapentin (NEURONTIN) 300 MG capsule Take 300 mg by mouth 3 (three) times daily. [phone] mg as needed (Patient not taking: No sig reported)     . lisinopril (PRINIVIL,ZESTRIL) 10 MG tablet Take 10 mg by mouth daily. (Patient not taking: No sig reported)     . valACYclovir (VALTREX) 1000 MG tablet Take 1 tablet (1,000 mg total) by mouth 3 (three) times daily. (Patient not taking: No sig reported) 21 tablet 0      Allergies  Allergen Reactions  . Penicillins Other (See Comments)     Gave yeast infection  . Ciprofloxacin   . Erythromycin Nausea And Vomiting     Past Medical History:  Diagnosis Date  . Anxiety   . Constipation   . Coronary artery disease   . Depression   . Hyperlipidemia   . Hypertension   . Neuromuscular disorder ([place])    peripheral neuropathy  . Pre-diabetes     Review of systems:  Otherwise negative.    Physical Exam  Gen: Alert, oriented. Appears stated age.  HEENT: PERRLA. Lungs: No respiratory distress CV: RRR Abd: soft, benign, no masses Ext: No edema    Planned procedures: Proceed with EGD/colonoscopy. The patient understands the nature of the planned procedure, indications, risks, alternatives and potential complications including but not limited to bleeding, infection, perforation, damage to internal organs and possible oversedation/side effects from anesthesia. The patient agrees and gives consent to proceed.  Please refer to procedure notes for findings, recommendations and patient disposition/instructions.     Tanya Miyamoto MD, MPH Gastroenterology 10/08/2020  1:52 PM

## 2020-10-08 NOTE — Transfer of Care (Signed)
Immediate Anesthesia Transfer of Care Note  Patient: Tanya Pace  Procedure(s) Performed: ESOPHAGOGASTRODUODENOSCOPY (EGD) (N/A ) COLONOSCOPY WITH PROPOFOL (N/A )  Patient Location: PACU  Anesthesia Type:General  Level of Consciousness: awake and sedated  Airway & Oxygen Therapy: Patient Spontanous Breathing and Patient connected to face mask oxygen  Post-op Assessment: Report given to RN and Post -op Vital signs reviewed and stable  Post vital signs: Reviewed and stable  Last Vitals:  Vitals Value Taken Time  BP    Temp    Pulse    Resp    SpO2      Last Pain:  Vitals:   10/08/20 1303  TempSrc: Temporal  PainSc: 0-No pain         Complications: No complications documented.

## 2020-10-09 ENCOUNTER — Ambulatory Visit
Admission: RE | Admit: 2020-10-09 | Discharge: 2020-10-09 | Disposition: A | Discharge status: Home / Self Care | Payer: Medicare Other | Source: Ambulatory Visit | Attending: Oncology | Admitting: Oncology

## 2020-10-09 DIAGNOSIS — R9389 Abnormal findings on diagnostic imaging of other specified body structures: Secondary | ICD-10-CM | POA: Diagnosis not present

## 2020-10-09 DIAGNOSIS — N858 Other specified noninflammatory disorders of uterus: Secondary | ICD-10-CM | POA: Insufficient documentation

## 2020-10-11 LAB — SURGICAL PATHOLOGY

## 2020-10-17 ENCOUNTER — Ambulatory Visit: Payer: Medicare Other | Admitting: Oncology

## 2020-10-17 ENCOUNTER — Inpatient Hospital Stay: Payer: Medicare Other | Attending: Oncology | Admitting: Oncology

## 2020-10-17 ENCOUNTER — Other Ambulatory Visit: Payer: Self-pay

## 2020-10-17 ENCOUNTER — Encounter: Payer: Self-pay | Admitting: Oncology

## 2020-10-17 ENCOUNTER — Inpatient Hospital Stay: Payer: Medicare Other

## 2020-10-17 VITALS — BP 124/81 | HR 78 | Temp 97.4°F | Resp 18 | Wt 174.3 lb

## 2020-10-17 DIAGNOSIS — Z800 Family history of malignant neoplasm of digestive organs: Secondary | ICD-10-CM | POA: Insufficient documentation

## 2020-10-17 DIAGNOSIS — Z87891 Personal history of nicotine dependence: Secondary | ICD-10-CM | POA: Diagnosis not present

## 2020-10-17 DIAGNOSIS — D72819 Decreased white blood cell count, unspecified: Secondary | ICD-10-CM | POA: Diagnosis not present

## 2020-10-17 DIAGNOSIS — D709 Neutropenia, unspecified: Secondary | ICD-10-CM | POA: Diagnosis present

## 2020-10-17 LAB — CBC WITH DIFFERENTIAL/PLATELET
Abs Immature Granulocytes: 0.01 10*3/uL (ref 0.00–0.07)
Basophils Absolute: 0.1 10*3/uL (ref 0.0–0.1)
Basophils Relative: 2 %
Eosinophils Absolute: 0.1 10*3/uL (ref 0.0–0.5)
Eosinophils Relative: 2 %
HCT: 36.8 % (ref 36.0–46.0)
Hemoglobin: 12.3 g/dL (ref 12.0–15.0)
Immature Granulocytes: 0 %
Lymphocytes Relative: 54 %
Lymphs Abs: 1.7 10*3/uL (ref 0.7–4.0)
MCH: 30.2 pg (ref 26.0–34.0)
MCHC: 33.4 g/dL (ref 30.0–36.0)
MCV: 90.4 fL (ref 80.0–100.0)
Monocytes Absolute: 0.3 10*3/uL (ref 0.1–1.0)
Monocytes Relative: 9 %
Neutro Abs: 1 10*3/uL — ABNORMAL LOW (ref 1.7–7.7)
Neutrophils Relative %: 33 %
Platelets: 275 10*3/uL (ref 150–400)
RBC: 4.07 MIL/uL (ref 3.87–5.11)
RDW: 14 % (ref 11.5–15.5)
WBC: 3.2 10*3/uL — ABNORMAL LOW (ref 4.0–10.5)
nRBC: 0 % (ref 0.0–0.2)

## 2020-10-17 NOTE — Progress Notes (Signed)
Hematology/Oncology Consult note 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 Mike Gip.  Work-upon 10/26/2018revealed ahematocrit of 41.5, hemoglobin 14.1, platelets 261,000, white count 4800 with an ANC of 1600. Differential included 33% segs, 54% lymphs, 10% monocytes, 2% eosinophils and 1% basophils.Normal studiesincluded: B12, folate, copper, andANA. Flow cytometryrevealed no diagnostic immunophenotypic abnormality. T cell gene rearrangement studies could be performed in the future if needed to assess for a clonal T-cell process. CD57+ cells were increased but consisted of a mixture of CD4 and CD8+ T cells and NK cells and thus clinically not felt to be LGL leukemia. There were no blasts.  HIV and hepatitis testing negative in the past.   Interval history-patient reports doing well and denies any specific complaints at this time.  Appetite and weight have remained stable  ECOG PS- 0 Pain scale- 0   Review of systems- Review of Systems  Constitutional: Negative for chills, fever, malaise/fatigue and weight loss.  HENT: Negative for congestion, ear discharge and nosebleeds.   Eyes: Negative for blurred vision.  Respiratory: Negative for cough, hemoptysis, sputum production, shortness of breath and wheezing.   Cardiovascular: Negative for chest pain, palpitations, orthopnea and claudication.  Gastrointestinal: Negative for abdominal pain, blood in stool,  constipation, diarrhea, heartburn, melena, nausea and vomiting.  Genitourinary: Negative for dysuria, flank pain, frequency, hematuria and urgency.  Musculoskeletal: Negative for back pain, joint pain and myalgias.  Skin: Negative for rash.  Neurological: Negative for dizziness, tingling, focal weakness, seizures, weakness and headaches.  Endo/Heme/Allergies: Does not bruise/bleed easily.  Psychiatric/Behavioral: Negative for depression and suicidal ideas. The patient does not have insomnia.       Allergies  Allergen Reactions  . Penicillins Other (See Comments)    Gave yeast infection  . Ciprofloxacin   . Erythromycin Nausea And Vomiting     Past Medical History:  Diagnosis Date  . Anxiety   . Constipation   . Coronary artery disease   . Depression   . Hyperlipidemia   . Hypertension   . Neuromuscular disorder ([place])    peripheral neuropathy  . Pre-diabetes      Past Surgical History:  Procedure Laterality Date  . COLONOSCOPY    . COLONOSCOPY WITH PROPOFOL N/A 03/25/2017   Procedure: COLONOSCOPY WITH PROPOFOL;  Surgeon: Toledo, Benay Pike, MD;  Location: [facility] ENDOSCOPY;  Service: Gastroenterology;  Laterality: N/A;  . COLONOSCOPY WITH PROPOFOL N/A 10/08/2020   Procedure: COLONOSCOPY WITH PROPOFOL;  Surgeon: Lesly Rubenstein, MD;  Location: [facility] ENDOSCOPY;  Service: Endoscopy;  Laterality: N/A;  . DILATION AND CURETTAGE OF UTERUS     x 5-6 [id number]  . ESOPHAGOGASTRODUODENOSCOPY N/A 10/08/2020   Procedure: ESOPHAGOGASTRODUODENOSCOPY (EGD);  Surgeon: Lesly Rubenstein, MD;  Location: [facility] ENDOSCOPY;  Service: Endoscopy;  Laterality: N/A;  REQUESTS LAST CASE OF DAY  . POLYPECTOMY      Social History   Socioeconomic History  . Marital status: Married    Spouse name: Not on file  . Number of children: Not on file  . Years of education: Not on file  .  Highest education level: Not on file  Occupational History  . Not on file  Tobacco Use  . Smoking status: Former  Smoker    Packs/day: 1.50    Years: 15.00    Pack years: 22.50    Quit date: 12/07/2013    Years since quitting: 6.8  . Smokeless tobacco: Never Used  Vaping Use  . Vaping Use: Never used  Substance and Sexual Activity  . Alcohol use: Yes    Comment: rare-2x a year  . Drug use: No  . Sexual activity: Yes  Other Topics Concern  . Not on file  Social History Narrative  . Not on file   Social Determinants of Health   Financial Resource Strain: Not on file  Food Insecurity: Not on file  Transportation Needs: Not on file  Physical Activity: Not on file  Stress: Not on file  Social Connections: Not on file  Intimate Partner Violence: Not on file    Family History  Problem Relation Age of Onset  . Colon cancer Mother   . Cancer Mother   . Heart disease Father   . Pancreatic cancer Cousin   . Esophageal cancer Neg Hx   . Rectal cancer Neg Hx   . Stomach cancer Neg Hx      Current Outpatient Medications:  .  Cholecalciferol (VITAMIN D3) 50 MCG (2000 UT) capsule, Take 2,000 Units by mouth daily., Disp: , Rfl:  .  diazepam (VALIUM) 5 MG tablet, Take 2.5 mg by mouth daily., Disp: , Rfl:  .  potassium chloride SA (KLOR-CON) 20 MEQ tablet, Take 1 tablet by mouth 2 (two) times daily., Disp: , Rfl:  .  simvastatin (ZOCOR) 10 MG tablet, Take 10 mg by mouth daily., Disp: , Rfl: 11 .  valsartan-hydrochlorothiazide (DIOVAN-HCT) 80-12.5 MG tablet, Take 1 tablet by mouth daily., Disp: , Rfl:  .  verapamil (CALAN-SR) 180 MG CR tablet, Take 2 tablets (360 mg total) by mouth at bedtime., Disp: 60 tablet, Rfl: 0 .  aspirin EC 81 MG tablet, Take 81 mg by mouth daily. (Patient not taking: No sig reported), Disp: , Rfl:  .  clindamycin (CLEOCIN) 300 MG capsule, Take 300 mg by mouth 3 (three) times daily. (Patient not taking: Reported on 10/17/2020), Disp: , Rfl:   Physical exam:  Vitals:   10/17/20 0904  BP: 124/81  Pulse: 78  Resp: 18  Temp: (!) 97.4 F (36.3 C)  SpO2: 99%  Weight: 174  lb 4.4 oz (79.1 kg)   Physical Exam Constitutional:      General: She is not in acute distress. Cardiovascular:     Rate and Rhythm: Normal rate and regular rhythm.     Heart sounds: Normal heart sounds.  Pulmonary:     Effort: Pulmonary effort is normal.     Breath sounds: Normal breath sounds.  Skin:    General: Skin is warm and dry.  Neurological:     Mental Status: She is alert and oriented to person, place, and time.      CMP Latest Ref Rng & Units 08/28/2020  Glucose 70 - 99 mg/dL 91  BUN 8 - 23 mg/dL 14  Creatinine 0.44 - 1.00 mg/dL 0.98  Sodium 135 - 145 mmol/L 136  Potassium 3.5 - 5.1 mmol/L 3.9  Chloride 98 - 111 mmol/L 102  CO2 22 - 32 mmol/L 31  Calcium 8.9 - 10.3 mg/dL 9.2  Total Protein 6.5 - 8.1 g/dL 7.5  Total Bilirubin 0.3 - 1.2 mg/dL 0.3  Alkaline Phos 38 - 126 U/L 59  AST 15 - 41 U/L 24  ALT 0 - 44 U/L 15   CBC Latest Ref Rng & Units 10/17/2020  WBC 4.0 - 10.5 K/uL 3.2(L)  Hemoglobin 12.0 - 15.0 g/dL 12.3  Hematocrit 36.0 - 46.0 % 36.8  Platelets 150 - 400 K/uL 275    No images are attached to the encounter.  US PELVIC COMPLETE WITH TRANSVAGINAL  Result Date: 10/09/2020 CLINICAL DATA:  Uterine mass on CT EXAM: TRANSABDOMINAL AND TRANSVAGINAL ULTRASOUND OF PELVIS TECHNIQUE: Both transabdominal and transvaginal ultrasound examinations of the pelvis were performed. Transabdominal technique was performed for global imaging of the pelvis including uterus, ovaries, adnexal regions, and pelvic cul-de-sac. It was necessary to proceed with endovaginal exam following the transabdominal exam to visualize the uterus endometrium ovaries. COMPARISON:  CT 08/14/2020 FINDINGS: Uterus Measurements: 7.4 x 3.8 x 4.3 cm = volume: 63 mL. Multiple myometrial masses consistent with fibroids. Posterior uterine corpus mass measuring 2.8 x 2.7 x 2.8 cm. Anterior intramural mass measuring 1.3 x 1 x 1.4 cm. Multiple calcified uterine masses at the fundus. Endometrium Thickness: 3.5  mm.  No focal abnormality visualized. Right ovary Measurements: 1.7 x 1.1 x 1.2 cm = volume: 1.2 mL. Normal appearance/no adnexal mass. Left ovary Not seen Other findings Dilated parauterine vessels. IMPRESSION: 1. Multiple uterine fibroids. 2. Nonvisualized left ovary 3. Dilated parauterine vessels, nonspecific, though can be seen with pelvic congestion syndrome. Electronically Signed   By: Donavan Foil M.D.   On: 10/09/2020 16:14     Assessment and plan- Patient is a 70 y.o. female here for follow-up of chronic neutropenia  Patient's white count today is 3.2 with an [place] of 1.  In 2018 her white count was 4.8 with an ANC of 1.6.  Presently her [place] is mildly lower as compared to a month ago.  I am inclined to monitor this conservatively without the need for a bone marrow biopsy at this time.  Repeat CBC with differential in 2 in 4 months and I will see her back in 4 months   Visit Diagnosis 1. Leukopenia, unspecified type      Dr. Randa Evens, MD, MPH [facility] [phone] 10/17/2020 3:42 PM

## 2020-12-18 ENCOUNTER — Other Ambulatory Visit: Payer: Self-pay

## 2020-12-18 ENCOUNTER — Inpatient Hospital Stay: Payer: Medicare Other | Attending: Oncology

## 2020-12-18 DIAGNOSIS — D72819 Decreased white blood cell count, unspecified: Secondary | ICD-10-CM | POA: Diagnosis not present

## 2020-12-18 LAB — CBC WITH DIFFERENTIAL/PLATELET
Abs Immature Granulocytes: 0.01 10*3/uL (ref 0.00–0.07)
Basophils Absolute: 0 10*3/uL (ref 0.0–0.1)
Basophils Relative: 1 %
Eosinophils Absolute: 0.1 10*3/uL (ref 0.0–0.5)
Eosinophils Relative: 2 %
HCT: 36 % (ref 36.0–46.0)
Hemoglobin: 12.2 g/dL (ref 12.0–15.0)
Immature Granulocytes: 0 %
Lymphocytes Relative: 57 %
Lymphs Abs: 1.9 10*3/uL (ref 0.7–4.0)
MCH: 30.3 pg (ref 26.0–34.0)
MCHC: 33.9 g/dL (ref 30.0–36.0)
MCV: 89.6 fL (ref 80.0–100.0)
Monocytes Absolute: 0.3 10*3/uL (ref 0.1–1.0)
Monocytes Relative: 8 %
Neutro Abs: 1 10*3/uL — ABNORMAL LOW (ref 1.7–7.7)
Neutrophils Relative %: 32 %
Platelets: 228 10*3/uL (ref 150–400)
RBC: 4.02 MIL/uL (ref 3.87–5.11)
RDW: 14 % (ref 11.5–15.5)
WBC: 3.3 10*3/uL — ABNORMAL LOW (ref 4.0–10.5)
nRBC: 0 % (ref 0.0–0.2)

## 2021-02-19 ENCOUNTER — Inpatient Hospital Stay: Payer: Medicare Other

## 2021-02-20 ENCOUNTER — Inpatient Hospital Stay: Payer: Medicare Other

## 2021-02-20 ENCOUNTER — Telehealth: Payer: Medicare Other | Admitting: Oncology

## 2021-12-02 ENCOUNTER — Other Ambulatory Visit: Payer: Self-pay | Admitting: Internal Medicine

## 2021-12-02 ENCOUNTER — Ambulatory Visit
Admission: RE | Admit: 2021-12-02 | Discharge: 2021-12-02 | Disposition: A | Discharge status: Home / Self Care | Payer: Medicare Other | Source: Ambulatory Visit | Attending: Internal Medicine | Admitting: Internal Medicine

## 2021-12-02 DIAGNOSIS — Z13820 Encounter for screening for osteoporosis: Secondary | ICD-10-CM

## 2021-12-02 DIAGNOSIS — Z139 Encounter for screening, unspecified: Secondary | ICD-10-CM

## 2022-04-18 IMAGING — US US PELVIS COMPLETE WITH TRANSVAGINAL
1 series · 13 of 25 positions shown · non-contrast
Comparison: CT 08/14/2020

CLINICAL DATA: Uterine mass on CT

EXAM:
TRANSABDOMINAL AND TRANSVAGINAL ULTRASOUND OF PELVIS
TECHNIQUE: Both transabdominal and transvaginal ultrasound examinations of the
pelvis were performed. Transabdominal technique was performed for
global imaging of the pelvis including uterus, ovaries, adnexal
regions, and pelvic cul-de-sac. It was necessary to proceed with
endovaginal exam following the transabdominal exam to visualize the
uterus endometrium ovaries.

[Series 1: us pelvis complete with transvaginal · 0.19mm/px · 13 of 30 slices shown]
[im 1/30]
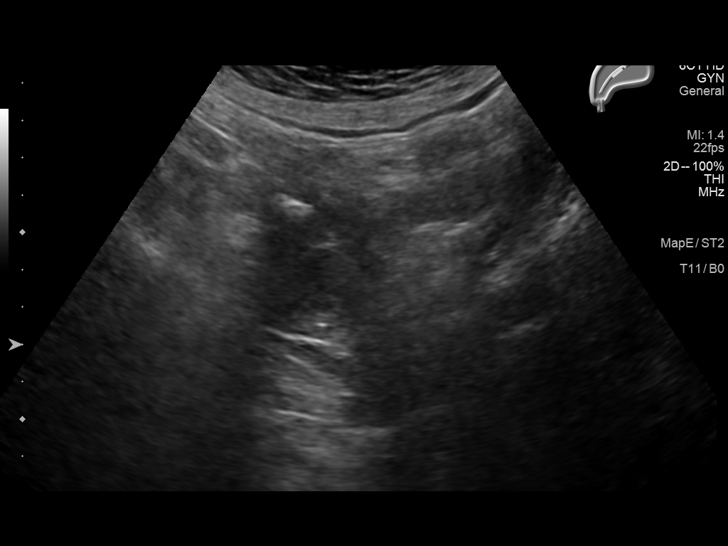
[im 3/30]
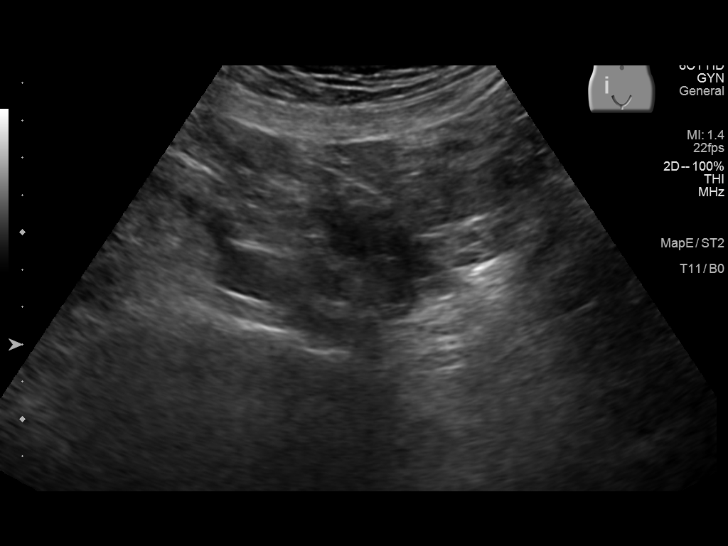
[im 5/30]
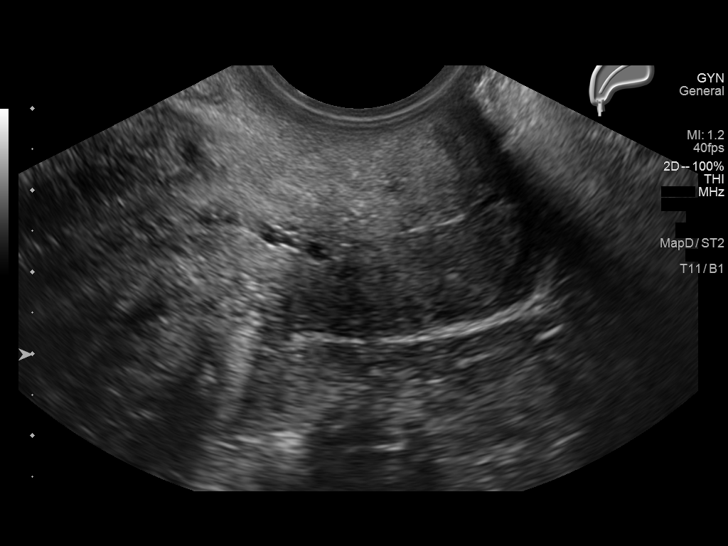
[im 8/30]
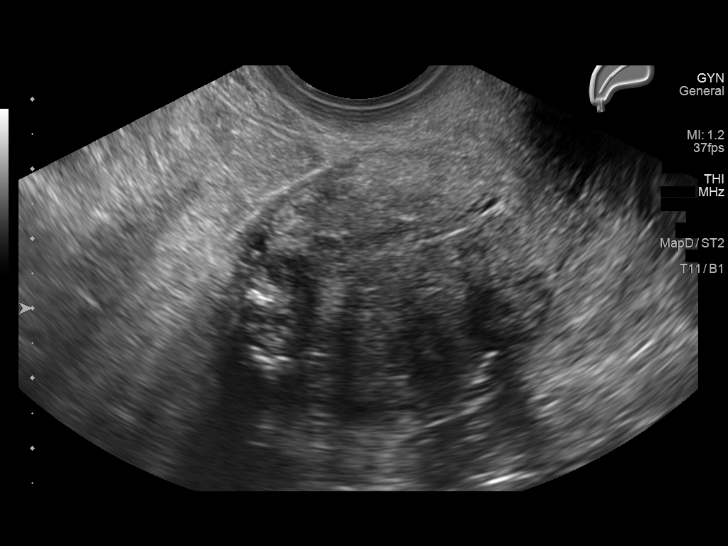
[im 10/30]
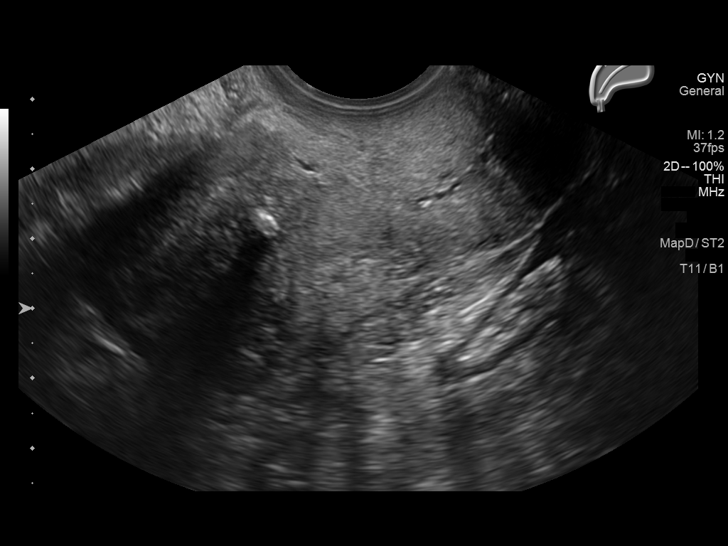
[im 13/30]
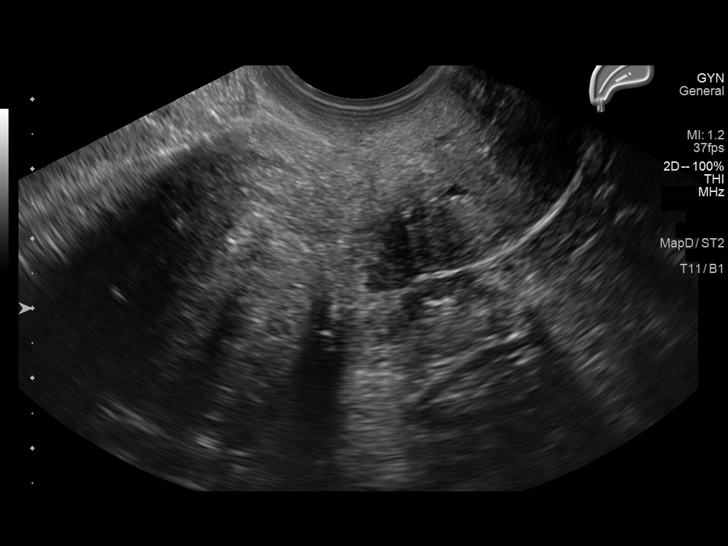
[im 15/30]
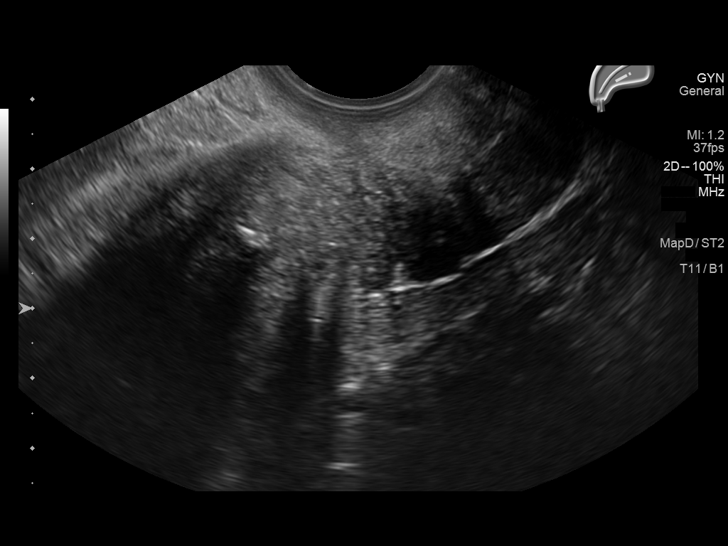
[im 17/30]
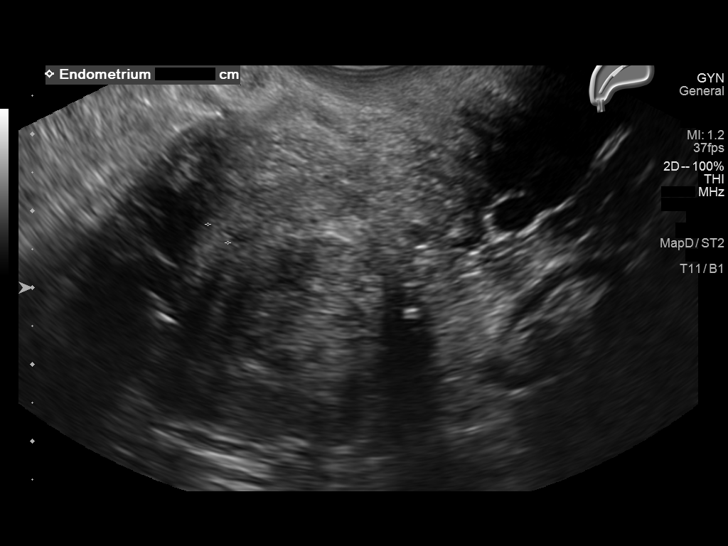
[im 20/30]
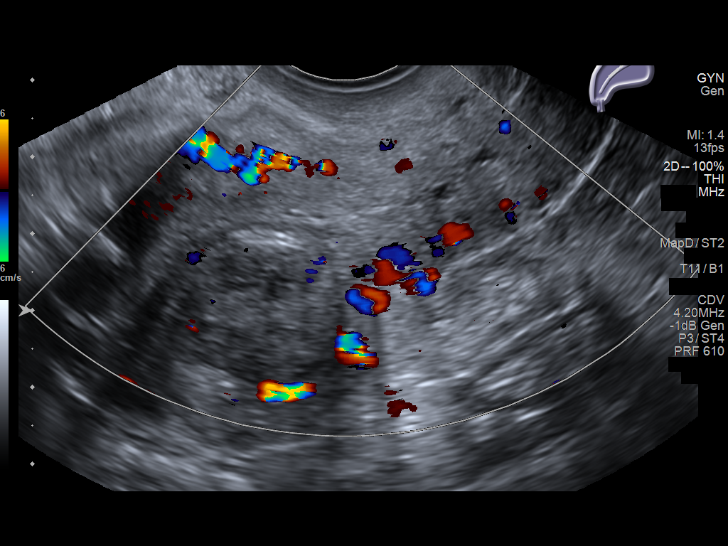
[im 22/30]
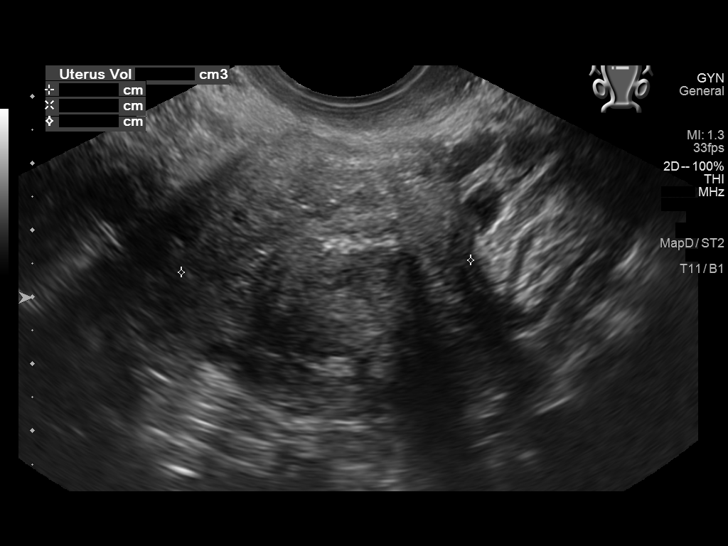
[im 25/30]
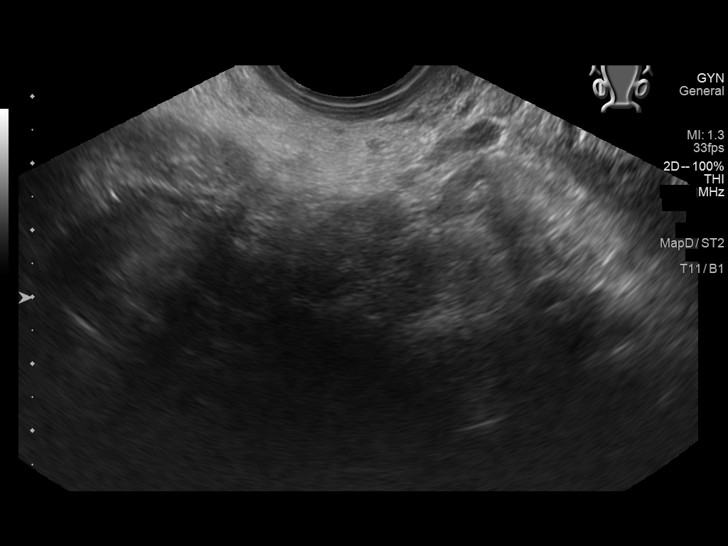
[im 27/30]
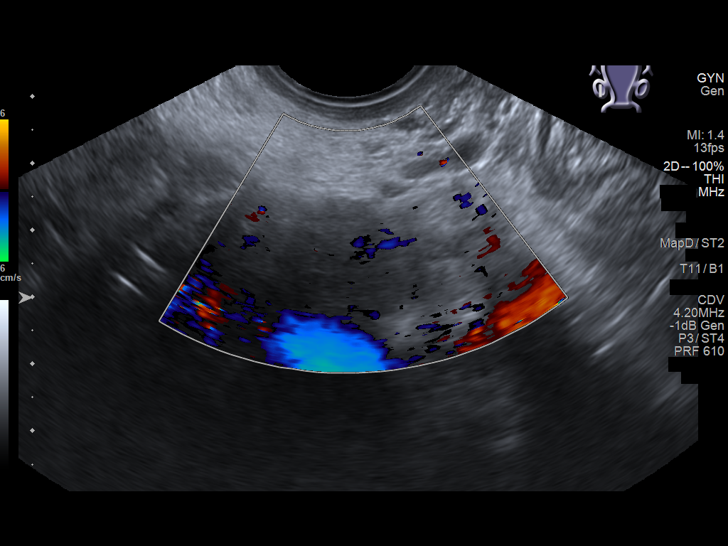
[im 30/30]
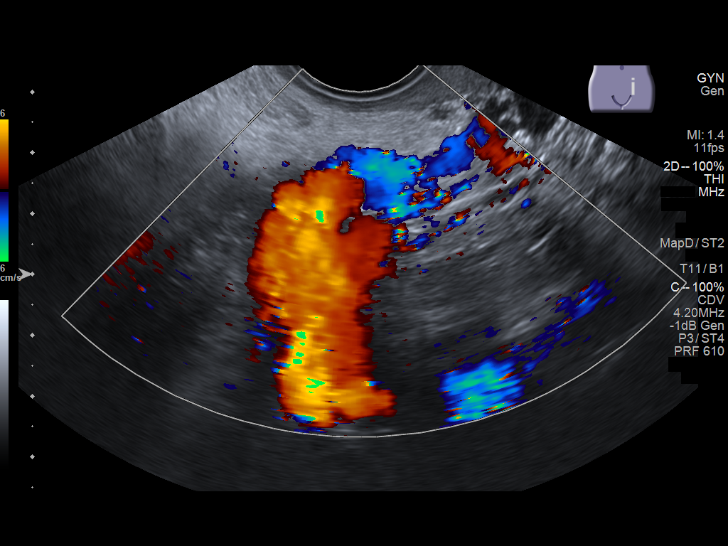

[13 of 25 positions shown; findings below may reference images not displayed]

FINDINGS: Uterus

Measurements: 7.4 x 3.8 x 4.3 cm = volume: 63 mL. Multiple
myometrial masses consistent with fibroids. Posterior uterine corpus
mass measuring 2.8 x 2.7 x 2.8 cm. Anterior intramural mass
measuring 1.3 x 1 x 1.4 cm. Multiple calcified uterine masses at the
fundus.

Endometrium

Thickness: 3.5 mm.  No focal abnormality visualized.

Right ovary

Measurements: 1.7 x 1.1 x 1.2 cm = volume: 1.2 mL. Normal
appearance/no adnexal mass.

Left ovary

Not seen

Other findings

Dilated parauterine vessels.
IMPRESSION: 1. Multiple uterine fibroids.
2. Nonvisualized left ovary
3. Dilated parauterine vessels, nonspecific, though can be seen with
pelvic congestion syndrome.

## 2023-12-02 ENCOUNTER — Encounter: Payer: Self-pay | Admitting: Internal Medicine

## 2023-12-03 ENCOUNTER — Other Ambulatory Visit: Payer: Self-pay | Admitting: Internal Medicine

## 2023-12-03 DIAGNOSIS — Z1231 Encounter for screening mammogram for malignant neoplasm of breast: Secondary | ICD-10-CM

## 2023-12-08 ENCOUNTER — Other Ambulatory Visit: Payer: Self-pay | Admitting: Internal Medicine

## 2023-12-08 DIAGNOSIS — Z780 Asymptomatic menopausal state: Secondary | ICD-10-CM

## 2024-06-06 ENCOUNTER — Other Ambulatory Visit: Payer: Self-pay | Admitting: Internal Medicine

## 2024-06-06 DIAGNOSIS — Z1231 Encounter for screening mammogram for malignant neoplasm of breast: Secondary | ICD-10-CM

## 2024-07-05 ENCOUNTER — Ambulatory Visit
